# Patient Record
Sex: Female | Born: 1960 | Race: White | Hispanic: No | Marital: Married | Smoking: Former smoker
Health system: Southern US, Community
[De-identification: ages and names within clinical notes are randomized; demographics above are authoritative.]

## PROBLEM LIST (undated history)

## (undated) DIAGNOSIS — E032 Hypothyroidism due to medicaments and other exogenous substances: Secondary | ICD-10-CM

## (undated) DIAGNOSIS — F419 Anxiety disorder, unspecified: Secondary | ICD-10-CM

## (undated) DIAGNOSIS — F32A Depression, unspecified: Secondary | ICD-10-CM

## (undated) DIAGNOSIS — K219 Gastro-esophageal reflux disease without esophagitis: Secondary | ICD-10-CM

## (undated) DIAGNOSIS — G43909 Migraine, unspecified, not intractable, without status migrainosus: Secondary | ICD-10-CM

## (undated) DIAGNOSIS — R739 Hyperglycemia, unspecified: Secondary | ICD-10-CM

## (undated) DIAGNOSIS — L309 Dermatitis, unspecified: Secondary | ICD-10-CM

## (undated) DIAGNOSIS — F329 Major depressive disorder, single episode, unspecified: Secondary | ICD-10-CM

## (undated) HISTORY — DX: Dermatitis, unspecified: L30.9

## (undated) HISTORY — DX: Migraine, unspecified, not intractable, without status migrainosus: G43.909

## (undated) HISTORY — DX: Hypothyroidism due to medicaments and other exogenous substances: E03.2

## (undated) HISTORY — DX: Hyperglycemia, unspecified: R73.9

## (undated) HISTORY — DX: Anxiety disorder, unspecified: F41.9

## (undated) HISTORY — DX: Depression, unspecified: F32.A

## (undated) HISTORY — PX: COLONOSCOPY: SHX174

## (undated) HISTORY — DX: Major depressive disorder, single episode, unspecified: F32.9

## (undated) HISTORY — PX: POLYPECTOMY: SHX149

---

## 1997-09-07 HISTORY — PX: BREAST SURGERY: SHX581

## 2001-07-25 ENCOUNTER — Other Ambulatory Visit: Admission: RE | Admit: 2001-07-25 | Discharge: 2001-07-25 | Payer: Self-pay | Admitting: Obstetrics and Gynecology

## 2002-09-07 HISTORY — PX: OTHER SURGICAL HISTORY: SHX169

## 2003-08-21 ENCOUNTER — Ambulatory Visit (HOSPITAL_COMMUNITY): Admission: RE | Admit: 2003-08-21 | Discharge: 2003-08-21 | Payer: Self-pay | Admitting: Orthopedic Surgery

## 2004-07-15 ENCOUNTER — Ambulatory Visit: Payer: Self-pay | Admitting: Internal Medicine

## 2004-11-26 ENCOUNTER — Ambulatory Visit: Payer: Self-pay | Admitting: Internal Medicine

## 2004-11-28 ENCOUNTER — Ambulatory Visit (HOSPITAL_COMMUNITY): Admission: RE | Admit: 2004-11-28 | Discharge: 2004-11-28 | Payer: Self-pay | Admitting: Internal Medicine

## 2004-12-13 ENCOUNTER — Emergency Department (HOSPITAL_COMMUNITY): Admission: EM | Admit: 2004-12-13 | Discharge: 2004-12-13 | Payer: Self-pay | Admitting: Family Medicine

## 2005-04-23 ENCOUNTER — Ambulatory Visit: Payer: Self-pay | Admitting: Endocrinology

## 2005-04-23 ENCOUNTER — Ambulatory Visit: Payer: Self-pay | Admitting: Internal Medicine

## 2005-09-15 ENCOUNTER — Other Ambulatory Visit: Admission: RE | Admit: 2005-09-15 | Discharge: 2005-09-15 | Payer: Self-pay | Admitting: Obstetrics and Gynecology

## 2006-01-30 ENCOUNTER — Emergency Department (HOSPITAL_COMMUNITY): Admission: EM | Admit: 2006-01-30 | Discharge: 2006-01-30 | Payer: Self-pay | Admitting: Family Medicine

## 2006-06-22 IMAGING — CT CT HEAD W/O CM
1 series · 16 of 30 positions shown, 20 images · non-contrast
Comparison: none

HISTORY: Migraines, dizziness, right-sided facial droop

[Series 2: head_seq 4.5 h40s st · axial · 0.43mm/px · z∈[-147,-21]mm · 16 of 32 slices shown, 20 images]
[im 2/32  brain]
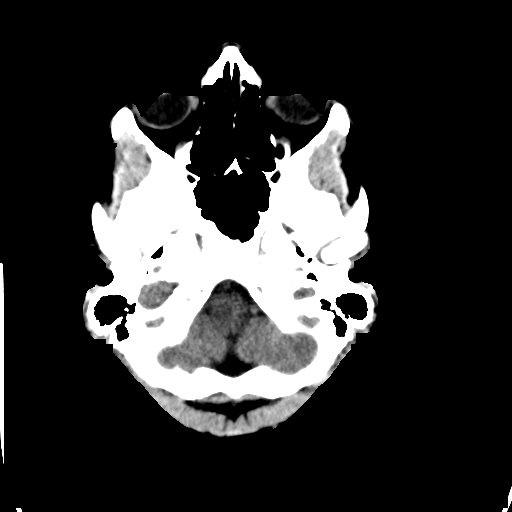
[im 2/32  bone]
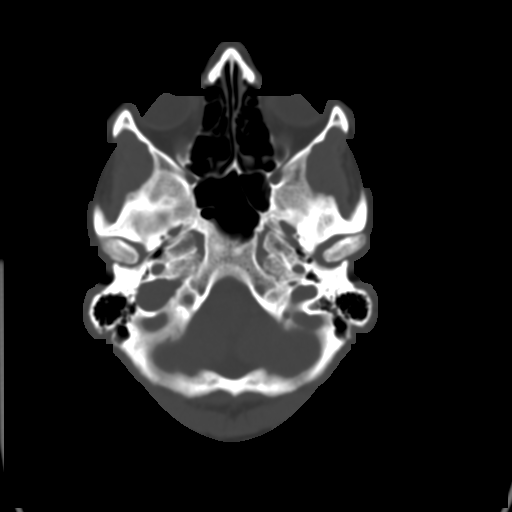
[im 4/32  brain]
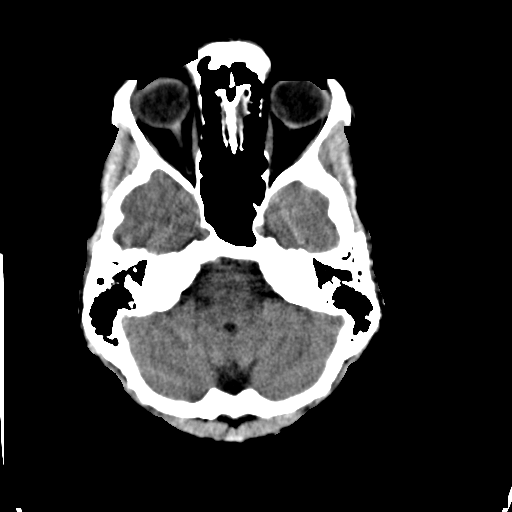
[im 6/32  brain]
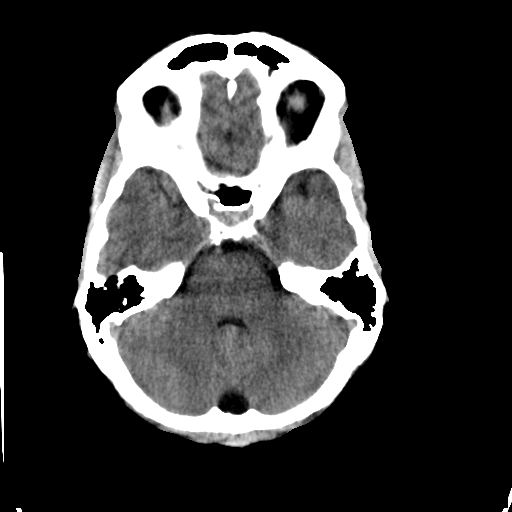
[im 8/32  brain]
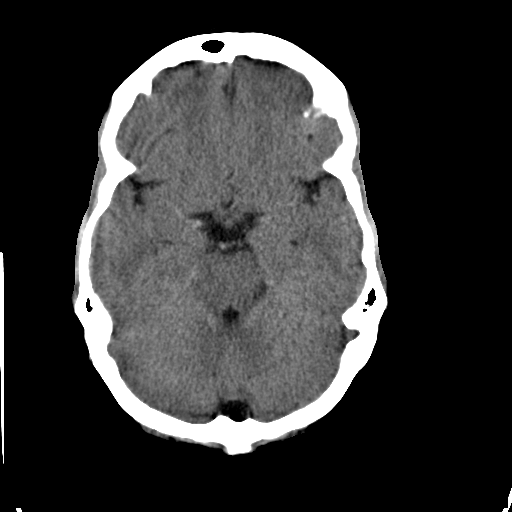
[im 9/32  brain]
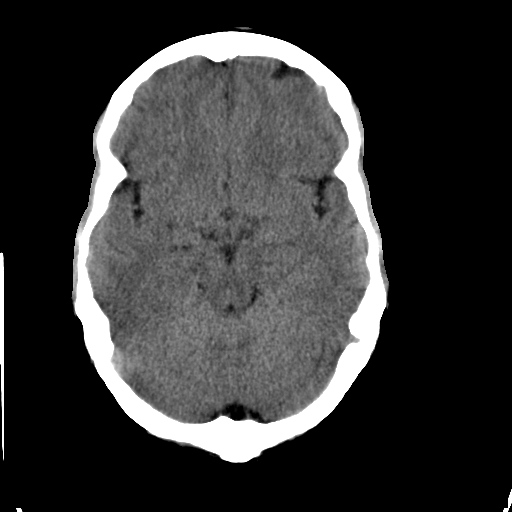
[im 9/32  bone]
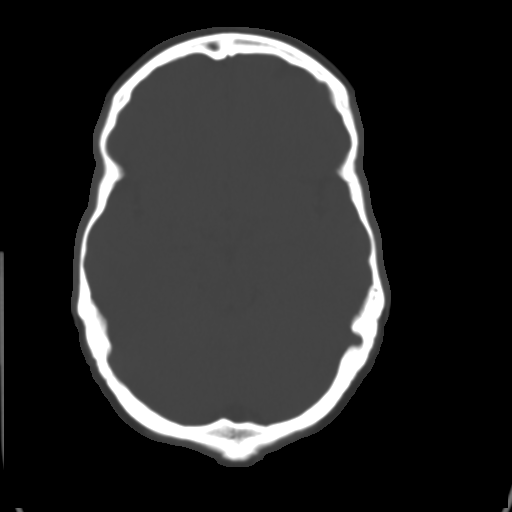
[im 11/32  brain]
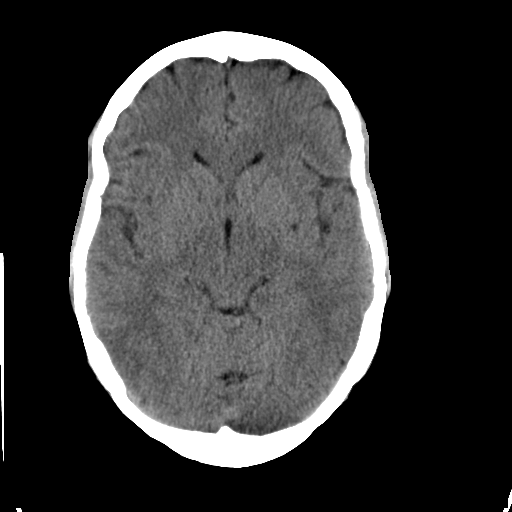
[im 13/32  brain]
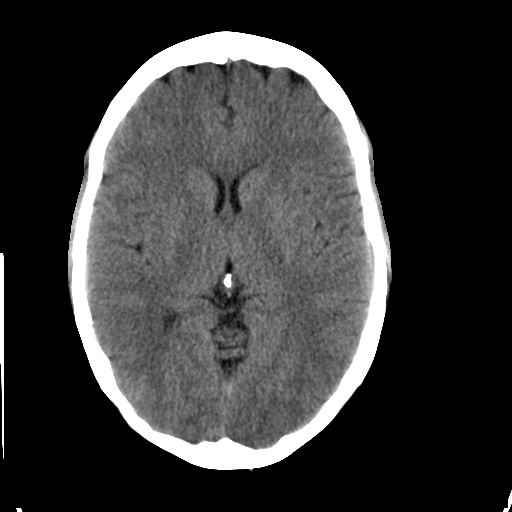
[im 15/32  brain]
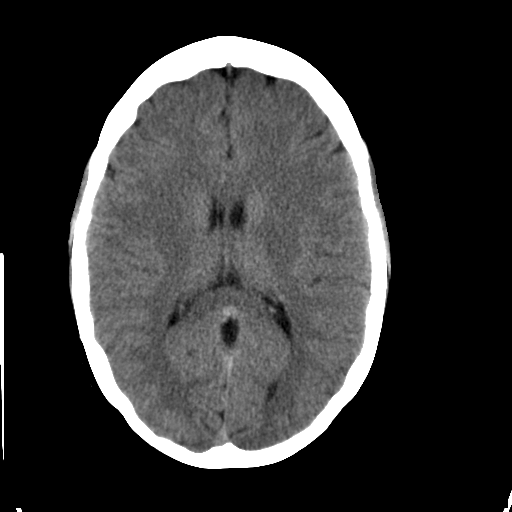
[im 17/32  brain]
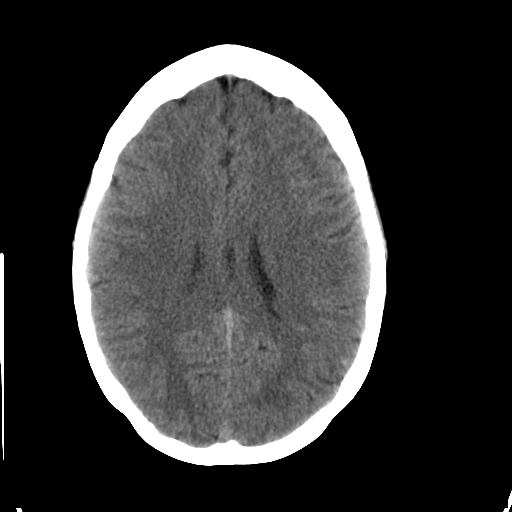
[im 17/32  bone]
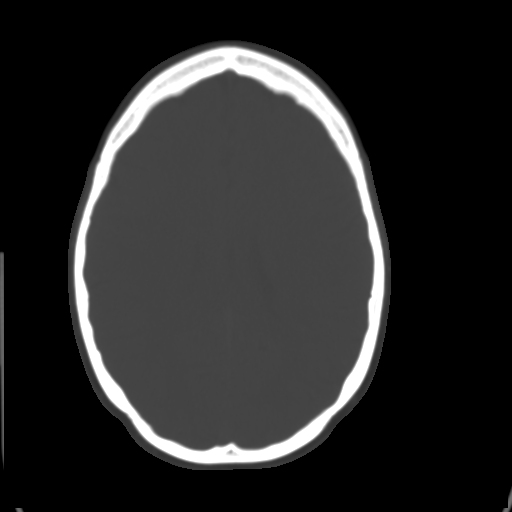
[im 19/32  brain]
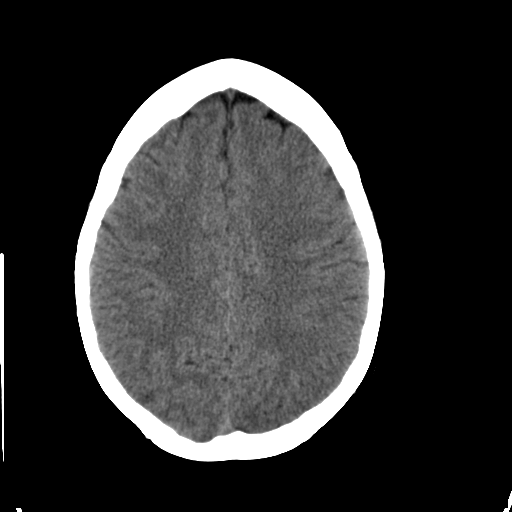
[im 21/32  brain]
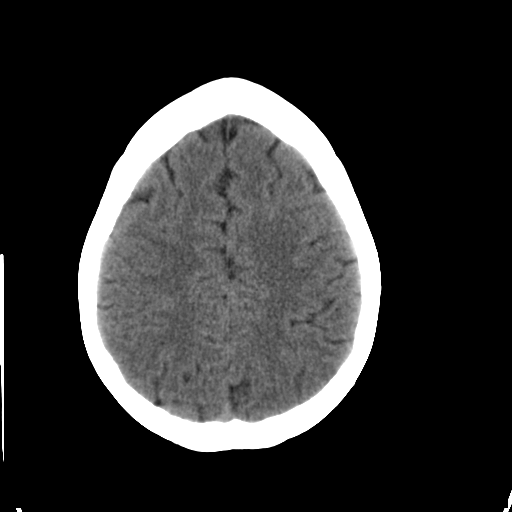
[im 23/32  brain]
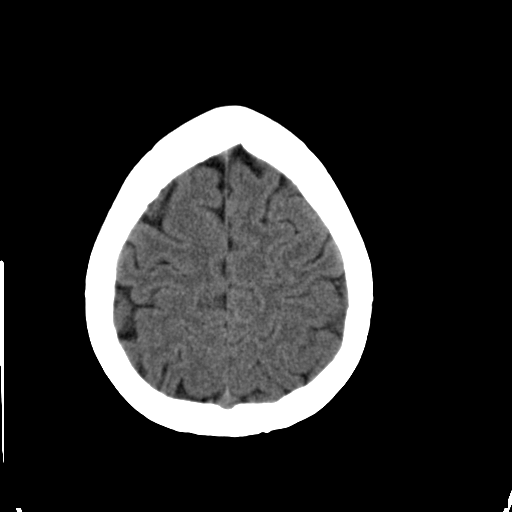
[im 24/32  brain]
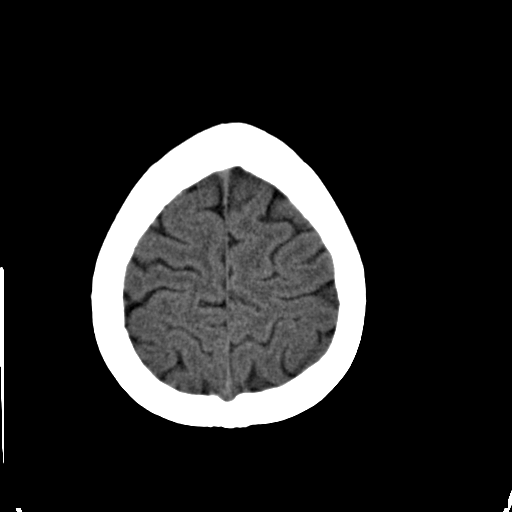
[im 24/32  bone]
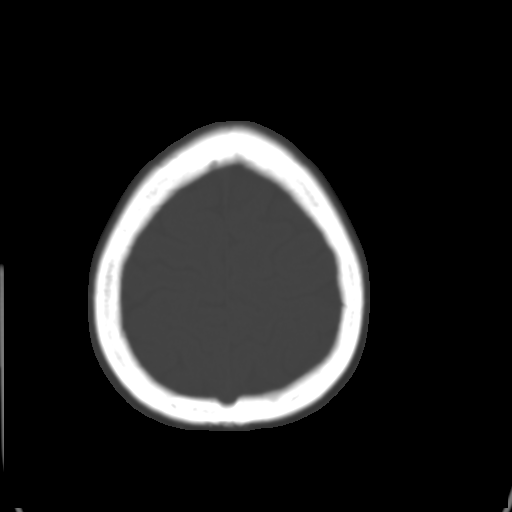
[im 26/32  brain]
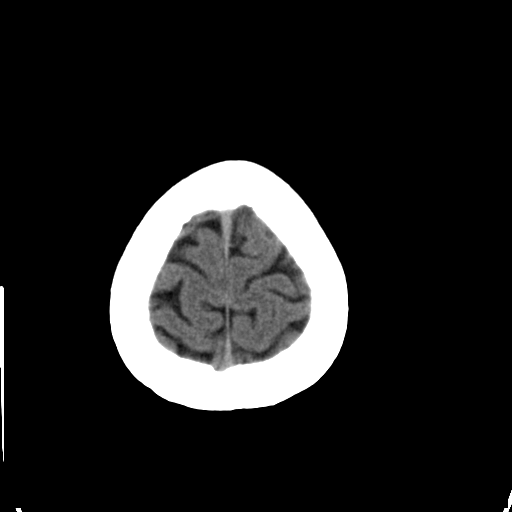
[im 28/32  brain]
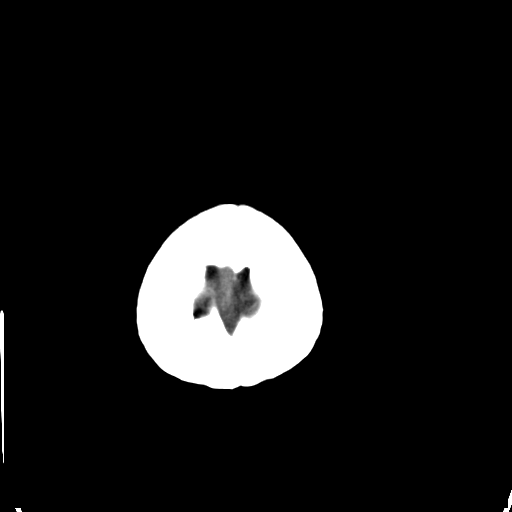
[im 30/32  brain]
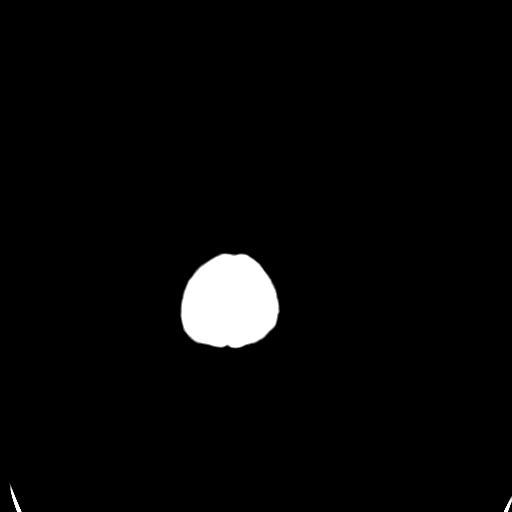

[16 of 30 positions shown; findings below may reference images not displayed]

CT HEAD WITHOUT CONTRAST:

Routine noncontrast CT without priors for comparison.

Normal ventricular morphology.
No midline shift or mass-effect.
No evidence of cortical infarct or intracranial hemorrhage.
Low attenuation focus in the inferior left basal ganglia, favor old lacunar
infarct.
Posterior fossa unremarkable.
Visualized sinuses clear.
Calvarium normal.
IMPRESSION: Probable old lacunar infarct at inferior aspect of left basal ganglia.
No acute intracranial abnormalities.

## 2006-11-25 ENCOUNTER — Emergency Department (HOSPITAL_COMMUNITY): Admission: EM | Admit: 2006-11-25 | Discharge: 2006-11-25 | Payer: Self-pay | Admitting: Emergency Medicine

## 2007-12-23 ENCOUNTER — Ambulatory Visit: Payer: Self-pay | Admitting: Gastroenterology

## 2008-01-13 ENCOUNTER — Ambulatory Visit: Payer: Self-pay | Admitting: Internal Medicine

## 2008-01-13 DIAGNOSIS — G43909 Migraine, unspecified, not intractable, without status migrainosus: Secondary | ICD-10-CM | POA: Insufficient documentation

## 2008-02-28 ENCOUNTER — Ambulatory Visit: Payer: Self-pay | Admitting: Gastroenterology

## 2008-04-06 ENCOUNTER — Ambulatory Visit: Payer: Self-pay | Admitting: Gastroenterology

## 2008-05-01 LAB — CONVERTED CEMR LAB: Pap Smear: NORMAL

## 2008-06-20 ENCOUNTER — Ambulatory Visit: Payer: Self-pay | Admitting: Internal Medicine

## 2008-07-05 ENCOUNTER — Encounter: Payer: Self-pay | Admitting: Internal Medicine

## 2009-07-29 ENCOUNTER — Ambulatory Visit (HOSPITAL_BASED_OUTPATIENT_CLINIC_OR_DEPARTMENT_OTHER): Admission: RE | Admit: 2009-07-29 | Discharge: 2009-07-29 | Payer: Self-pay | Admitting: Internal Medicine

## 2009-07-29 ENCOUNTER — Ambulatory Visit: Payer: Self-pay | Admitting: Internal Medicine

## 2009-07-29 ENCOUNTER — Ambulatory Visit: Payer: Self-pay | Admitting: Diagnostic Radiology

## 2009-07-30 ENCOUNTER — Telehealth: Payer: Self-pay | Admitting: Internal Medicine

## 2009-09-07 HISTORY — PX: THYROID SURGERY: SHX805

## 2009-11-20 ENCOUNTER — Encounter: Payer: Self-pay | Admitting: Internal Medicine

## 2009-12-02 ENCOUNTER — Encounter: Payer: Self-pay | Admitting: Internal Medicine

## 2009-12-04 ENCOUNTER — Ambulatory Visit (HOSPITAL_BASED_OUTPATIENT_CLINIC_OR_DEPARTMENT_OTHER): Admission: RE | Admit: 2009-12-04 | Discharge: 2009-12-04 | Payer: Self-pay | Admitting: Internal Medicine

## 2009-12-04 ENCOUNTER — Ambulatory Visit: Payer: Self-pay | Admitting: Internal Medicine

## 2009-12-04 ENCOUNTER — Ambulatory Visit: Payer: Self-pay | Admitting: Diagnostic Radiology

## 2009-12-04 ENCOUNTER — Telehealth: Payer: Self-pay | Admitting: Internal Medicine

## 2009-12-04 LAB — CONVERTED CEMR LAB: Free T4: 0.96 ng/dL (ref 0.80–1.80)

## 2009-12-17 ENCOUNTER — Other Ambulatory Visit: Admission: RE | Admit: 2009-12-17 | Discharge: 2009-12-17 | Payer: Self-pay | Admitting: Interventional Radiology

## 2009-12-17 ENCOUNTER — Encounter: Payer: Self-pay | Admitting: Internal Medicine

## 2009-12-17 ENCOUNTER — Encounter: Admission: RE | Admit: 2009-12-17 | Discharge: 2009-12-17 | Payer: Self-pay | Admitting: Internal Medicine

## 2009-12-24 ENCOUNTER — Telehealth: Payer: Self-pay | Admitting: Internal Medicine

## 2010-01-01 ENCOUNTER — Encounter (INDEPENDENT_AMBULATORY_CARE_PROVIDER_SITE_OTHER): Payer: Self-pay | Admitting: *Deleted

## 2010-02-10 ENCOUNTER — Encounter: Payer: Self-pay | Admitting: Internal Medicine

## 2010-02-14 ENCOUNTER — Ambulatory Visit (HOSPITAL_COMMUNITY): Admission: RE | Admit: 2010-02-14 | Discharge: 2010-02-14 | Payer: Self-pay | Admitting: Endocrinology

## 2010-03-11 ENCOUNTER — Encounter: Payer: Self-pay | Admitting: Internal Medicine

## 2010-04-24 ENCOUNTER — Ambulatory Visit (HOSPITAL_COMMUNITY): Admission: RE | Admit: 2010-04-24 | Discharge: 2010-04-25 | Payer: Self-pay | Admitting: Surgery

## 2010-04-24 ENCOUNTER — Encounter (INDEPENDENT_AMBULATORY_CARE_PROVIDER_SITE_OTHER): Payer: Self-pay | Admitting: Surgery

## 2010-05-07 ENCOUNTER — Encounter: Payer: Self-pay | Admitting: Internal Medicine

## 2010-06-25 ENCOUNTER — Encounter: Payer: Self-pay | Admitting: Internal Medicine

## 2010-10-07 NOTE — Letter (Signed)
Summary: [facility]  [facility]   Imported By: Lanelle Bal 12/13/2009 09:28:41  _____________________________________________________________________  External Attachment:    Type:   Image     Comment:   External Document

## 2010-10-07 NOTE — Progress Notes (Signed)
Summary: Thyroid Results  Phone Note Call from Patient Call back at Home Phone [phone]   Caller: Patient Reason for Call: Lab or Test Results Summary of Call: Pls contact pt when results are available Initial call taken by: Lannette Donath,  December 24, 2009 4:17 PM  Follow-up for Phone Call        call  returned to patient for additional information on results that she is requesting. Patient is requesting the results of Thyroid biopsy done last week. She states the radiologist informed her that she would notified of the results by Dr Artist Pais Follow-up by: Glendell Docker CMA,  December 24, 2009 4:27 PM  Additional Follow-up for Phone Call Additional follow up Details #1::        call pathology dept and see if biopsy results avail Additional Follow-up by: D. Thomos Lemons DO,  December 24, 2009 4:46 PM    Additional Follow-up for Phone Call Additional follow up Details #2::    CYT-Cytology - STATUS: Final  .                                         Perform Date: 12Apr11 00:01  Ordered By: Hoy Finlay MD , PROVIDER         Ordered Date:  Facility: [facility]                              Department: [facility]  Service Report Text  [facility]   [address]   [place], [place] [address]   Telephone [phone] or [phone] Fax [phone]    REPORT OF CYTOPATHOLOGY    Case #: [id number]   Patient Name: DESIA, SABAN   Office Chart Number: [id number]    MRN: [id number]   Pathologist: Luisa Hart M.D., Jonny Ruiz   DOB/Age Dec 24, 1960 (Age: 62) Gender: F   Date Taken: 12/17/2009   Date Received: 12/17/2009    FINAL DIAGNOSIS   ***Microscopic Examination and Diagnosis***   STATEMENT Of SPECIMEN ADEQUACY:   Satisfactory for evaluation.    INTERPRETATION(S):   THYROID, FINE NEEDLE ASPIRATION, LEFT: FOLLICULAR LESION.   Comment: The differential diagnosis includes a hyperplastic lesion and an   adenomatous nodule.    DATE REPORTED: 12/18/2009   *** Electronically  Signed Out by Luisa Hart M.D., Jonny Ruiz, Pathologist, Electronic Signature ***    CLINICAL HISTORY    SOURCE OF SPECIMEN(S)   Thyroid, fine needle aspiration    SPECIMEN COMMENTS:   1. Left thyroid nodule on outside mri, llp dominant nodule 2.5 x 1.2 x 1.4cm    Gross Description   Specimen: Received is/are 30cc's of light peach cytolyt solution and 6 slides in   95% ethyl alcohol.(gw:gw)   Prepared:   # smears: 6   # concentration technique slides (i.e. Thinprep): 1   # cell block: 1   additional studies: N/A   Additional Information  HL7 RESULT STATUS : F  External IF Update Timestamp : 2009-12-18:17:22:00.000000  Follow-up by: Glendell Docker CMA,  December 25, 2009 10:35 AM  Additional Follow-up for Phone Call Additional follow up Details #3:: Details for Additional Follow-up Action Taken: Pls call pt at [phone] to let her know the results of her tests Diane Tomerlin  December 30, 2009 3:14 PM  called pt - left message to return call re:  biopsy results D.  Thomos Lemons DO  December 31, 2009 1:22 PM  I discussed results with pt.  I advised she follow up with endo re:  mgt of thyroid nodule Additional Follow-up by: D. Thomos Lemons DO,  December 31, 2009 3:41 PM

## 2010-10-07 NOTE — Assessment & Plan Note (Signed)
Summary: F/u on a MRI - jr   Vital Signs:  Patient profile:   50 year old female Height:      72 inches Weight:      178.50 pounds BMI:     24.30 O2 Sat:      99 % on Room air Temp:     98.5 degrees F oral Pulse rate:   60 / minute Pulse rhythm:   regular Resp:     14 per minute BP sitting:   100 / 70  (right arm) Cuff size:   large  Vitals Entered By: Glendell Docker CMA (December 04, 2009 10:48 AM)  O2 Flow:  Room air CC: Rm 2- Thyroid evaluation   Primary Care Provider:  Dondra Spry DO  CC:  Rm 2- Thyroid evaluation.  History of Present Illness: 50 y/o white female with hx of migraine headache and dysthymia for followup she has been followed by dentist and oral surgeon abnormal left mandible MRI of neck obtained. It showed incidental small left thyroid nodule pt is asymptomatic. no prior hx of radiation exposure no family hx of thyroid cancer wt gain since prev visit  Preventive Screening-Counseling & Management  Alcohol-Tobacco     Smoking Status: quit  Allergies (verified): No Known Drug Allergies  Past History:  Past Medical History: Migraine / cluster Headaches      Past Surgical History: Right knee arthroscopic surgery 2004 Breast augmentation 1999 (saline)    Family History: Father with history of colon cancer - diagnosed at age 87 Mother with history of breast caner diagnosed at age 63 No family hx of thyroid cancer   Social History: Occupation:  Radiographer, therapeutic Married with two daughters Never Smoked Alcohol use-yes    Physical Exam  General:  alert, well-developed, and well-nourished.   Neck:  supple, no masses, no thyromegaly, and no thyroid nodules or tenderness.     Impression & Recommendations:  Problem # 1:  THYROID NODULE, LEFT (ICD-241.0) Incidental left thyroid nodule.  Pt asymptomatic.   arrange TFTs and thyroid U/S  Orders: Ultrasound (Ultrasound) T-TSH ([address]-60454) T-T4, Free (09811-91478)  Complete Medication  List: 1)  Imitrex 100 Mg Tabs (Sumatriptan succinate) .... One by mouth qd 2)  Sertraline Hcl 50 Mg Tabs (Sertraline hcl) .... Take 1 tablet by mouth once a day 3)  Omeprazole 20 Mg Cpdr (Omeprazole) .... One by mouth two times a day 4)  Cyclobenzaprine Hcl 5 Mg Tabs (Cyclobenzaprine hcl) .... One by mouth at bedtime as needed  Current Allergies (reviewed today): No known allergies    Preventive Care Screening  Pap Smear:    Date:  10/01/2009    Results:  normal   Mammogram:    Date:  08/27/2009    Results:  normal

## 2010-10-07 NOTE — Letter (Signed)
Summary: [facility]   Imported By: Lanelle Bal 07/14/2010 12:10:44  _____________________________________________________________________  External Attachment:    Type:   Image     Comment:   External Document

## 2010-10-07 NOTE — Letter (Signed)
Summary: [facility]   Imported By: Lanelle Bal 04/25/2010 09:57:56  _____________________________________________________________________  External Attachment:    Type:   Image     Comment:   External Document

## 2010-10-07 NOTE — Progress Notes (Signed)
  Phone Note Outgoing Call   Summary of Call: call pt - thyroid u/s confirms left lower thyroid nodule 2.5 x 1.2 x 1.4 cm in size.  I rec we arrange FNA   Initial call taken by: D. Thomos Lemons DO,  December 04, 2009 9:00 PM  Follow-up for Phone Call        thyroid blood tests are normal Follow-up by: D. Thomos Lemons DO,  December 05, 2009 8:22 AM  Additional Follow-up for Phone Call Additional follow up Details #1::        Pt notified as directed     will schedule FNA  at W/L   Additional Follow-up by: Darral Dash,  December 05, 2009 10:26 AM

## 2010-10-07 NOTE — Letter (Signed)
Summary: Primary Care Consult Scheduled Letter  [facility]  [address]   [place], [place] [address]   Phone: [phone]  Fax: [phone]      01/01/2010 MRN: [id number]  Elizabeth Obrien [address] [place], [place]  [address]    Dear [name],      We have scheduled an appointment for you.  At the recommendation of Dr._YOO, we have scheduled you a consult with DR [facility] on JUNE 6,2011 at [facility].  Their address is_[address], [place]  . The office phone number is [phone].  If this appointment day and time is not convenient for you, please feel free to call the office of the doctor you are being referred to at the number listed above and reschedule the appointment.     It is important for you to keep your scheduled appointments. We are here to make sure you are given good patient care. If you have questions or you have made changes to your appointment, please notify us at  [phone], ask for HELEN.    Thank you,  Darral Dash Patient Care Coordinator [place] [facility]

## 2010-10-07 NOTE — Letter (Signed)
Summary: [facility]   Imported By: Lanelle Bal 03/27/2010 07:48:39  _____________________________________________________________________  External Attachment:    Type:   Image     Comment:   External Document

## 2010-10-07 NOTE — Letter (Signed)
Summary: [facility]   Imported By: Lanelle Bal 06/10/2010 10:25:02  _____________________________________________________________________  External Attachment:    Type:   Image     Comment:   External Document

## 2010-11-03 ENCOUNTER — Telehealth (INDEPENDENT_AMBULATORY_CARE_PROVIDER_SITE_OTHER): Payer: Self-pay | Admitting: *Deleted

## 2010-11-13 NOTE — Progress Notes (Signed)
  Phone Note Other Incoming   Request: Send information Summary of Call: Request for records received from [facility]. Request forwarded to [employer].  Last 3 yrs-Yoo

## 2010-11-21 LAB — CBC
HCT: 39.5 % (ref 36.0–46.0)
MCV: 92.7 fL (ref 78.0–100.0)
Platelets: 196 10*3/uL (ref 150–400)
RBC: 4.27 MIL/uL (ref 3.87–5.11)
WBC: 5.9 10*3/uL (ref 4.0–10.5)

## 2010-11-21 LAB — BASIC METABOLIC PANEL
CO2: 27 mEq/L (ref 19–32)
Calcium: 10 mg/dL (ref 8.4–10.5)
GFR calc Af Amer: >60 mL/min (ref >60)
Potassium: 3.9 mEq/L (ref 3.5–5.1)
Sodium: 141 mEq/L (ref 135–145)

## 2010-11-21 LAB — SURGICAL PCR SCREEN
MRSA, PCR: NEGATIVE
Staphylococcus aureus: NEGATIVE

## 2010-11-21 LAB — DIFFERENTIAL
Eosinophils Absolute: 0.1 10*3/uL (ref 0.0–0.7)
Eosinophils Relative: 2 % (ref 0–5)

## 2010-11-21 LAB — PROTIME-INR
INR: 1 (ref 0.00–1.49)
Prothrombin Time: 13.4 seconds (ref 11.6–15.2)

## 2010-11-21 LAB — URINALYSIS, ROUTINE W REFLEX MICROSCOPIC
Protein, ur: NEGATIVE mg/dL
Specific Gravity, Urine: 1.006 (ref 1.005–1.030)
Urobilinogen, UA: 0.2 mg/dL (ref 0.0–1.0)
pH: 6 (ref 5.0–8.0)

## 2011-01-20 NOTE — Assessment & Plan Note (Signed)
[facility]                         GASTROENTEROLOGY OFFICE NOTE   Elizabeth Obrien, Elizabeth Obrien                     MRN:          [id number]  DATE:12/23/2007                            DOB:          [birth year]    PROBLEM:  Nausea.   Elizabeth Obrien is a pleasant, 50 year old white female, self-referred  for evaluation of nausea.  Since September of 2008, she has been  complaining of chronic nausea throughout the day.  It is not worsened  postprandially.  She does have migraine headaches and, when she develops  headaches, she will vomit, as well.  She has had frequent regurgitation  of gastric contents.  She has frequent pyrosis, as well.  She denies  dysphagia or odynophagia.  Omeprazole has not helped her symptoms.  She  is on no gastric irritants, including nonsteroidals, though, prior to  September, she had been taking nonsteroidals intermittently.  There has  been no change in her bowel habits.  She has been taking sertraline and  Topamax for years.   FAMILY HISTORY:  Pertinent for father who had colon cancer at age 57.   PAST MEDICAL HISTORY:  Unremarkable.   FAMILY HISTORY:  Also pertinent for mother with stomach cancer.   MEDICATIONS:  Include sertraline, Topamax, Prilosec, magnesium and fish  oil.   ALLERGIES:  She has no allergies.   SOCIAL HISTORY:  She neither smokes nor drinks.  She is married and  works as an Astronomer.   REVIEW OF SYSTEMS:  Was reviewed and is positive for occasional urinary  incontinence.   PHYSICAL EXAMINATION:  Pulse 88, blood pressure 94/58, weight 165.  HEENT: EOMI.  PERRLA.  Sclerae are anicteric.  Conjunctivae are pink.  NECK:  Supple without thyromegaly, adenopathy or carotid bruits.  CHEST:  Clear to auscultation and percussion without adventitious  sounds.  CARDIAC:  Regular rhythm; normal S1 S2.  There are no murmurs, gallops  or rubs.  ABDOMEN:  There is no succussion splash.  Bowel sounds are normoactive.  Abdomen is soft, nontender and nondistended.  There are no abdominal  masses, tenderness, splenic enlargement or hepatomegaly.  EXTREMITIES:  Full range of motion.  No cyanosis, clubbing or edema.  RECTAL:  There are no masses.  Stool is Hemoccult negative.  SKELETAL EXAM:  There are no deformities.  NEUROLOGIC EXAM:  Nonfocal.   IMPRESSION:  Persistent nausea with pyrosis.  Symptoms certainly could  be due to reflux that has been refractory to omeprazole.  Gastroparesis  may be a contributing factor, as well.   RECOMMENDATION:  1. Trial of Capadex 50 mg twice a day.  2. Upper endoscopy.  3. To consider gastric emptying scan if symptoms are no better and      endoscopy is not revealing.     Barbette Hair. Arlyce Dice, MD,FACG  Electronically Signed    RDK/MedQ  DD: 12/23/2007  DT: 12/23/2007  Job #: [id number]

## 2011-01-20 NOTE — Letter (Signed)
December 23, 2007    Mrs. Johnette Abraham   RE:  MIASHA, EMMONS  MRN:  [id number]  /  DOB:  1961-01-20   Dear Mrs. Colville:   It is my pleasure to have treated you recently as a new patient in my  office.  I appreciate your confidence and the opportunity to participate  in your care.   Since I do have a busy inpatient endoscopy schedule and office schedule,  my office hours vary weekly.  I am, however, available for emergency  calls every day through my office.  If I cannot promptly meet an urgent  office appointment, another one of our gastroenterologists will be able  to assist you.   My well-trained staff are prepared to help you at all times.  For  emergencies after office hours, a physician from our gastroenterology  section is always available through my 24-hour answering service.   While you are under my care, I encourage discussion of your questions  and concerns, and I will be happy to return your calls as soon as I am  available.   Once again, I welcome you as a new patient and I look forward to a happy  and healthy relationship.    Sincerely,      Barbette Hair. Arlyce Dice, MD,FACG  Electronically Signed   RDK/MedQ  DD: 12/23/2007  DT: 12/23/2007  Job #: [id number]

## 2011-01-23 NOTE — Op Note (Signed)
NAMESHYNIECE, SCRIPTER NO.:  [id number]   MEDICAL RECORD NO.:  [id number]                   PATIENT TYPE:  OIB   LOCATION:  2899                                 FACILITY:  [facility]   PHYSICIAN:  Burnard Bunting, M.D.                 DATE OF BIRTH:  May 04, 1961   DATE OF PROCEDURE:  08/21/2003  DATE OF DISCHARGE:                                 OPERATIVE REPORT   PREOPERATIVE DIAGNOSIS:  Right knee posterior horn medial meniscal tear.   POSTOPERATIVE DIAGNOSIS:  Right knee posterior horn medial meniscal tear.   PROCEDURE:  Right knee diagnostic and operative arthroscopy with partial  posterior horn medial meniscectomy.   SURGEON ATTENDING:  Burnard Bunting, M.D.   ANESTHESIA:  LMA plus block.   ESTIMATED BLOOD LOSS:  3 mL.   DRAINS:  None.   OPERATIVE FINDINGS:  1. Examination under anesthesia:  Range of motion 0 to 130 with stability to     varus and varus stress at 0 and 30 degrees, ACL and PCL intact, no     posterolateral rotatory instability was noted.  2. Diagnostic and operative arthroscopy:     a. 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     b. No loose bodies in the medial or lateral gutters.     c. Intact lateral compartment with intact lateral meniscus and articular        cartilage.     d. Intact ACL and PCL.     e. 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   PROCEDURE IN DETAIL:  The patient was brought to the operating room where  LMA anesthesia was induced.  A perioperative block had been administered.  The right knee, leg and foot were prepped with Duraprep solution and were  draped in a  sterile manner.  Topographical anatomy of the knee was  identified including the medial and lateral margins of the patella as well  as the joint line and both inferior, medial and lateral margins of the  patellar tendon.  Anterior inferolateral portal was first established,  anterior inferomedial portal was then established after spinal needle  localization.  The diagnostic arthroscopy was performed.  The patellofemoral  compartment was intact, except for some mild chondromalacia on the medial  and lateral facets of the patella.  The trochlea itself was intact.  There  were loose bodies in the medial and lateral gutter.  The lateral compartment  in a figure-of-four in position was inspected and found to be intact with  stable meniscus and intact articular cartilage.  The ACL and PCL were  intact.  Medial compartment demonstrated a tear in the posterior horn of the  medial meniscus involving about 50-60% anteroposterior depth of the meniscus  over half of its circumference.  There was an unstable tear with fraying and  degeneration.  This tear was debrided back to a stable rim using a  combination of basket punch and shavers.  Following  debridement, the  meniscus was probed and found to be stable.  At this time, the joint was  thoroughly  irrigated.  Instruments were removed from the portals, which were then  closed using 3-0 nylon suture.  A combination of morphine, Marcaine and  clonidine was then injected into the knee.  The patient was then placed in  the bulky knee wrap.  She tolerated the procedure well without immediate  complications.                                               Burnard Bunting, M.D.    GSD/MEDQ  D:  08/21/2003  T:  08/22/2003  Job:  [id number]

## 2011-06-03 ENCOUNTER — Ambulatory Visit (HOSPITAL_BASED_OUTPATIENT_CLINIC_OR_DEPARTMENT_OTHER)
Admission: RE | Admit: 2011-06-03 | Discharge: 2011-06-03 | Disposition: A | Discharge status: Home / Self Care | Payer: BC Managed Care – PPO | Source: Ambulatory Visit | Attending: Orthopedic Surgery | Admitting: Orthopedic Surgery

## 2011-06-03 DIAGNOSIS — M2240 Chondromalacia patellae, unspecified knee: Secondary | ICD-10-CM | POA: Insufficient documentation

## 2011-06-03 DIAGNOSIS — X58XXXA Exposure to other specified factors, initial encounter: Secondary | ICD-10-CM | POA: Insufficient documentation

## 2011-06-03 DIAGNOSIS — IMO0002 Reserved for concepts with insufficient information to code with codable children: Secondary | ICD-10-CM | POA: Insufficient documentation

## 2011-06-03 DIAGNOSIS — M25569 Pain in unspecified knee: Secondary | ICD-10-CM | POA: Insufficient documentation

## 2011-06-03 DIAGNOSIS — Z79899 Other long term (current) drug therapy: Secondary | ICD-10-CM | POA: Insufficient documentation

## 2011-06-03 LAB — POCT HEMOGLOBIN-HEMACUE: Hemoglobin: 13.8 g/dL (ref 12.0–15.0)

## 2011-06-17 NOTE — Op Note (Signed)
  NAMESHALAE, BELMONTE NO.:  [id number]  MEDICAL RECORD NO.:  [id number]  LOCATION:                                 FACILITY:  PHYSICIAN:  Elizabeth Obrien, M.D.         DATE OF BIRTH:  DATE OF PROCEDURE:  06/03/2011 DATE OF DISCHARGE:                              OPERATIVE REPORT   PREOPERATIVE DIAGNOSIS:  Left knee medial meniscal tear.  POSTOPERATIVE DIAGNOSES:  Left knee medial meniscal tear plus chondral defect, medial femoral condyle.  PROCEDURE:  Left knee arthroscopy, meniscal debridement, and chondroplasty.  SURGEON:  Elizabeth Gross, MD  ASSISTANT:  None.  ANESTHESIA:  General.  ESTIMATED BLOOD LOSS:  Minimal.  DRAIN:  None.  COMPLICATIONS:  None.  CONDITION:  Stable to Recovery.  BRIEF CLINICAL NOTE:  Elizabeth Obrien is a 50 year old female, several month history of significant with left knee pain, recurrent effusion, mechanical symptoms.  Exam and history suggested medial meniscal tear, confirmed by MRI.  She presents for arthroscopy and debridement.  PROCEDURE IN DETAIL:  After successful administration of general anesthetic, a tourniquet was placed high on the left thigh, and left lower extremity prepped and draped in usual sterile fashion.  Standard superomedial and inferolateral incisions were made, inflow cannula passed superomedial and camera passed inferolateral.  Arthroscopic visualization proceeds.  Undersurface of the patella shows some grade 1 and 2 chondromalacia, but no full-thickness chondral defects and no unstable cartilage.  The trochlea looked normal.  Medial and lateral gutters were visualized, there were no loose bodies.  Flexion and valgus force was applied to the knee and the medial compartment was entered. There was evidence of a significant unstable tear in the body and posterior horn of the medial meniscus.  There was also some chondromalacia of the medial femoral condyle with a small area of unstable cartilage.  The  spinal needle was used to localize inferomedial portal.  Small incision made and dilator placed.  Meniscus was debrided back to stable base with baskets and a 4.2 mm shaver and then sealed off with the ArthroCare device.  Unstable cartilage undersurface of the medial femoral condyle was debrided back to stable cartilaginous surface with stable edges.  The intercondylar notch was visualized, ACL was normal.  Lateral compartment was entered it looked normal.  Joint was again inspected.  No other tears, defects, or loose bodies noted. Arthroscopic equipments were then removed from the inferior portals which was closed with interrupted 4-0 nylon.  A 20 cc of 0.25% Marcaine with epinephrine were injected through the inflow cannula, that was removed, and that portal closed with nylon. The incision was then cleaned and dried and a bulky sterile dressing applied.  She was then awakened and transported to Recovery in stable condition.     Elizabeth Obrien, M.D.     FA/MEDQ  D:  06/03/2011  T:  06/03/2011  Job:  [id number]  Electronically Signed by Elizabeth Obrien M.D. on 06/17/2011 11:18:09 AM

## 2012-09-05 ENCOUNTER — Encounter: Payer: Self-pay | Admitting: Internal Medicine

## 2012-09-05 ENCOUNTER — Ambulatory Visit (INDEPENDENT_AMBULATORY_CARE_PROVIDER_SITE_OTHER)
Admission: RE | Admit: 2012-09-05 | Discharge: 2012-09-05 | Disposition: A | Discharge status: Home / Self Care | Payer: 59 | Source: Ambulatory Visit | Attending: Internal Medicine | Admitting: Internal Medicine

## 2012-09-05 ENCOUNTER — Ambulatory Visit (INDEPENDENT_AMBULATORY_CARE_PROVIDER_SITE_OTHER): Payer: 59 | Admitting: Internal Medicine

## 2012-09-05 ENCOUNTER — Ambulatory Visit: Payer: 59

## 2012-09-05 VITALS — BP 112/78 | Temp 98.2°F | Ht 71.0 in | Wt 184.0 lb

## 2012-09-05 DIAGNOSIS — R071 Chest pain on breathing: Secondary | ICD-10-CM

## 2012-09-05 DIAGNOSIS — G43909 Migraine, unspecified, not intractable, without status migrainosus: Secondary | ICD-10-CM

## 2012-09-05 DIAGNOSIS — R0789 Other chest pain: Secondary | ICD-10-CM

## 2012-09-05 DIAGNOSIS — E041 Nontoxic single thyroid nodule: Secondary | ICD-10-CM

## 2012-09-05 LAB — CBC WITH DIFFERENTIAL/PLATELET
Lymphs Abs: 2.5 10*3/uL (ref 0.7–4.0)
Monocytes Absolute: 0.5 10*3/uL (ref 0.1–1.0)
Platelets: 221 10*3/uL (ref 150–400)
RDW: 13.5 % (ref 11.5–15.5)

## 2012-09-05 LAB — D-DIMER, QUANTITATIVE: D-Dimer, Quant: 0.41 ug/mL-FEU (ref 0.00–0.48)

## 2012-09-05 MED ORDER — TRAMADOL HCL 50 MG PO TABS: 50.0000 mg | ORAL_TABLET | Freq: Three times a day (TID) | ORAL | Status: DC | PRN

## 2012-09-05 MED ORDER — SUMATRIPTAN SUCCINATE 100 MG PO TABS: 100.0000 mg | ORAL_TABLET | ORAL | Status: DC | PRN

## 2012-09-05 NOTE — Assessment & Plan Note (Signed)
51 year old white female with pleuritic type chest pain on right side. Physical exam is normal. Patient may have pleurisy versus musculoskeletal etiology. Obtain chest x-ray and d-dimer. If negative, patient advised to use tramadol 50 mg 3 times a day as needed. If persistent pain, reassess in one week.

## 2012-09-05 NOTE — Assessment & Plan Note (Signed)
Patient with chronic periodic migraines. Refilled Imitrex.

## 2012-09-05 NOTE — Assessment & Plan Note (Signed)
Patient underwent subtotal thyroidectomy. Pathology was negative for thyroid cancer.  She has iatrogenic hypothyroidism. She is followed by Dr. Lurene Shadow

## 2012-09-05 NOTE — Progress Notes (Signed)
  Subjective:    Patient ID: Elizabeth Obrien, female    DOB: 03/15/1961, 51 y.o.   MRN: [id number]  HPI  51 year old white female with history of benign thyroid nodule and migraine headache complains of acute right mid upper back pain that started Saturday morning. She denies any specific trigger. She woke up with pain. It lasted all day Saturday. She describes a stabbing sensation that is worse with taking a deep breath. She denies any cough or shortness of breath. No preceding illness. She tried using muscle relaxers and getting in a hot tub without any relief.  She has tender area right side of back. No obvious rash.   Review of Systems Negative for chest pain or shortness of breath  Past Medical History  Diagnosis Date  . Migraine   . Thyroid nodule 2011  . Hypothyroidism, iatrogenic     Follow by Dr. Lurene Shadow    History   Social History  . Marital Status: Married    Spouse Name: N/A    Number of Children: 2  . Years of Education: N/A   Occupational History  . PACE nurse [employer]   Social History Main Topics  . Smoking status: Former Games developer  . Smokeless tobacco: Not on file  . Alcohol Use: Yes  . Drug Use: Not on file  . Sexually Active: Not on file   Other Topics Concern  . Not on file   Social History Narrative  . No narrative on file    Past Surgical History  Procedure Date  . Right knee 2004    arthroscopic  . Breast surgery 1999    augmentation - saline  . Thyroid surgery 2011    Family History  Problem Relation Age of Onset  . Cancer Mother 49    breast  . Cancer Father     colon  . Cancer Other     thyroid    Not on File  Current Outpatient Prescriptions on File Prior to Visit  Medication Sig Dispense Refill  . levothyroxine (SYNTHROID, LEVOTHROID) 50 MCG tablet Take 50 mcg by mouth daily.      . SUMAtriptan (IMITREX) 100 MG tablet Take 1 tablet (100 mg total) by mouth every 2 (two) hours as needed for migraine.  10 tablet  5    BP  112/78  Temp 98.2 F (36.8 C) (Oral)  Ht 5\' 11"  (1.803 m)  Wt 184 lb (83.462 kg)  BMI 25.66 kg/m2       Objective:   Physical Exam  Constitutional: She appears well-developed and well-nourished.  HENT:  Head: Normocephalic and atraumatic.  Right Ear: External ear normal.  Left Ear: External ear normal.  Mouth/Throat: Oropharynx is clear and moist.  Neck: Neck supple.       No neck tenderness  Cardiovascular: Normal rate, regular rhythm and normal heart sounds.   No murmur heard. Pulmonary/Chest: Effort normal and breath sounds normal. She has no wheezes. She has no rales.       Right posterior chest wall/back tenderness (thoracic level T9-T10)  Abdominal: Soft. Bowel sounds are normal. There is no tenderness.  Musculoskeletal: She exhibits no edema.  Lymphadenopathy:    She has no cervical adenopathy.  Skin: Skin is warm and dry. No rash noted.  Psychiatric: She has a normal mood and affect. Her behavior is normal.          Assessment & Plan:

## 2012-09-05 NOTE — Patient Instructions (Signed)
Our office will contact you re: blood test and chest x ray results. Please call our office if your symptoms do not improve or gets worse.

## 2013-02-14 ENCOUNTER — Encounter: Payer: Self-pay | Admitting: Gastroenterology

## 2013-08-23 ENCOUNTER — Telehealth: Payer: Self-pay | Admitting: Internal Medicine

## 2013-08-23 NOTE — Telephone Encounter (Signed)
Patient Information:  Caller Name: GREGORIA  Phone: [phone]  Patient: Elizabeth Obrien  Gender: Female  DOB: [birth year]  Age: 52 Years  PCP: Artist Pais Doe-Hyun Molly Maduro) (Adults only)  Pregnant: No  Office Follow Up:  Does the office need to follow up with this patient?: No  Instructions For The Office: N/A   Symptoms  Reason For Call & Symptoms: Patient refuses triage x 2 attempts and states she cut her hand with a knife and only wants to know if Dr. Artist Pais does stitches in the office because if he does NOT then she is just going on to work and having them take care of it there. When attempting to provide this information to the patient, she states "this is my work calling, I have to go" and disconnected.  Reviewed Health History In EMR: N/A  Reviewed Medications In EMR: N/A  Reviewed Allergies In EMR: N/A  Reviewed Surgeries / Procedures: N/A  Date of Onset of Symptoms: Unknown OB / GYN:  LMP: Unknown  Guideline(s) Used:  No Protocol Available - Information Only  Disposition Per Guideline:   Home Care  Reason For Disposition Reached:   Information only question and nurse able to answer  Advice Given:  N/A  Patient Refused Recommendation:  Patient Refused Care Advice  Patient disconnected.

## 2013-11-30 DIAGNOSIS — L988 Other specified disorders of the skin and subcutaneous tissue: Secondary | ICD-10-CM | POA: Insufficient documentation

## 2014-06-26 LAB — HM MAMMOGRAPHY: HM MAMMO: NORMAL

## 2014-07-05 ENCOUNTER — Encounter: Payer: Self-pay | Admitting: Internal Medicine

## 2014-07-13 ENCOUNTER — Ambulatory Visit: Payer: 59 | Admitting: Internal Medicine

## 2014-07-25 ENCOUNTER — Ambulatory Visit (INDEPENDENT_AMBULATORY_CARE_PROVIDER_SITE_OTHER): Payer: 59 | Admitting: Family Medicine

## 2014-07-25 ENCOUNTER — Encounter: Payer: Self-pay | Admitting: Family Medicine

## 2014-07-25 VITALS — BP 110/82 | HR 61 | Temp 98.4°F | Ht 71.0 in | Wt 180.1 lb

## 2014-07-25 DIAGNOSIS — R21 Rash and other nonspecific skin eruption: Secondary | ICD-10-CM

## 2014-07-25 DIAGNOSIS — G43909 Migraine, unspecified, not intractable, without status migrainosus: Secondary | ICD-10-CM

## 2014-07-25 MED ORDER — TRIAMCINOLONE ACETONIDE 0.1 % EX OINT: 1.0000 "application " | TOPICAL_OINTMENT | Freq: Two times a day (BID) | CUTANEOUS | Status: DC

## 2014-07-25 MED ORDER — SUMATRIPTAN SUCCINATE 100 MG PO TABS: 100.0000 mg | ORAL_TABLET | ORAL | Status: DC | PRN

## 2014-07-25 NOTE — Patient Instructions (Signed)
For Rash: -try triamcinilone cream twice daily -CERAVE CREAM twice daily -aquafor at work and avoid water as much as possible at home and work  Follow up in 1 month - schedule New Patient Visit if you will be transferring care.

## 2014-07-25 NOTE — Progress Notes (Signed)
Pre visit review using our clinic review tool, if applicable. No additional management support is needed unless otherwise documented below in the visit note. 

## 2014-07-25 NOTE — Progress Notes (Signed)
HPI:  Acute visit for:  1) Rash: -itchy on hands and feet -for a long time, at least 9 months -otc hydrocortisone helps a little -denies pain or ulcers  2)Migraines: -needs refill on imitrex -chronic for many years, has seen multiple headache specialist   ROS: See pertinent positives and negatives per HPI.  Past Medical History  Diagnosis Date  . Migraine   . Thyroid nodule 2011  . Hypothyroidism, iatrogenic     Follow by Dr. Bubba Camp    Past Surgical History  Procedure Laterality Date  . Right knee  2004    arthroscopic  . Breast surgery  1999    augmentation - saline  . Thyroid surgery  2011    Family History  Problem Relation Age of Onset  . Cancer Mother 69    breast  . Cancer Father     colon  . Cancer Other     thyroid    History   Social History  . Marital Status: Married    Spouse Name: N/A    Number of Children: 2  . Years of Education: N/A   Occupational History  . [employer]   Social History Main Topics  . Smoking status: Former Research scientist (life sciences)  . Smokeless tobacco: None  . Alcohol Use: Yes  . Drug Use: None  . Sexual Activity: None   Other Topics Concern  . None   Social History Narrative    Current outpatient prescriptions: aspirin-acetaminophen-caffeine (EXCEDRIN MIGRAINE) 250-250-65 MG per tablet, Take by mouth every 6 (six) hours as needed for headache., Disp: , Rfl: ;  levothyroxine (SYNTHROID, LEVOTHROID) 50 MCG tablet, Take 50 mcg by mouth daily., Disp: , Rfl: ;  SUMAtriptan (IMITREX) 100 MG tablet, Take 1 tablet (100 mg total) by mouth every 2 (two) hours as needed for migraine., Disp: 10 tablet, Rfl: 5 triamcinolone ointment (KENALOG) 0.1 %, Apply 1 application topically 2 (two) times daily., Disp: 30 g, Rfl: 0  EXAM:  Filed Vitals:   07/25/14 0919  BP: 110/82  Pulse: 61  Temp: 98.4 F (36.9 C)    Body mass index is 25.13 kg/(m^2).  GENERAL: vitals reviewed and listed above, alert, oriented, appears well  hydrated and in no acute distress  HEENT: atraumatic, conjunttiva clear, no obvious abnormalities on inspection of external nose and ears  NECK: no obvious masses on inspection  LUNGS: clear to auscultation bilaterally, no wheezes, rales or rhonchi, good air movement  CV: HRRR, no peripheral edema  MS: moves all extremities without noticeable abnormality  PSYCH: pleasant and cooperative, no obvious depression or anxiety  ASSESSMENT AND PLAN:  Discussed the following assessment and plan:  Rash and nonspecific skin eruption - Plan: triamcinolone ointment (KENALOG) 0.1 % -we discussed possible serious and likely etiologies, workup and treatment, treatment risks and return precautions - this looks like eczema, however discussed potential for other etiologies and opted for close follow up and further eval if needed -follow up advised 1 month -of course, we advised Keoni  to return or notify a doctor immediately if symptoms worsen or persist or new concerns arise.  Migraine without status migrainosus, not intractable, unspecified migraine type - Plan: SUMAtriptan (IMITREX) 100 MG tablet  -Patient advised to return or notify a doctor immediately if symptoms worsen or persist or new concerns arise.  Patient Instructions  For Rash: -try triamcinilone cream twice daily -CERAVE CREAM twice daily -aquafor at work and avoid water as much as possible at home and work  Follow up  in 1 month - schedule New Patient Visit if you will be transferring care.     Colin Benton R.

## 2014-09-20 ENCOUNTER — Encounter: Payer: Self-pay | Admitting: Family Medicine

## 2014-09-20 ENCOUNTER — Ambulatory Visit (INDEPENDENT_AMBULATORY_CARE_PROVIDER_SITE_OTHER): Payer: 59 | Admitting: Family Medicine

## 2014-09-20 VITALS — BP 102/80 | HR 72 | Temp 98.0°F | Ht 71.0 in | Wt 185.5 lb

## 2014-09-20 DIAGNOSIS — Z131 Encounter for screening for diabetes mellitus: Secondary | ICD-10-CM

## 2014-09-20 DIAGNOSIS — L309 Dermatitis, unspecified: Secondary | ICD-10-CM | POA: Insufficient documentation

## 2014-09-20 DIAGNOSIS — G43009 Migraine without aura, not intractable, without status migrainosus: Secondary | ICD-10-CM

## 2014-09-20 DIAGNOSIS — Z13220 Encounter for screening for lipoid disorders: Secondary | ICD-10-CM

## 2014-09-20 DIAGNOSIS — G44009 Cluster headache syndrome, unspecified, not intractable: Secondary | ICD-10-CM

## 2014-09-20 DIAGNOSIS — K219 Gastro-esophageal reflux disease without esophagitis: Secondary | ICD-10-CM

## 2014-09-20 DIAGNOSIS — E039 Hypothyroidism, unspecified: Secondary | ICD-10-CM

## 2014-09-20 DIAGNOSIS — E041 Nontoxic single thyroid nodule: Secondary | ICD-10-CM

## 2014-09-20 LAB — LIPID PANEL
CHOLESTEROL: 171 mg/dL (ref 0–200)
HDL: 62.9 mg/dL (ref >39.00)
LDL Cholesterol: 97 mg/dL (ref 0–99)
NonHDL: 108.1
TRIGLYCERIDES: 57 mg/dL (ref 0.0–149.0)
Total CHOL/HDL Ratio: 3
VLDL: 11.4 mg/dL (ref 0.0–40.0)

## 2014-09-20 LAB — HEMOGLOBIN A1C: Hgb A1c MFr Bld: 6 % (ref 4.6–6.5)

## 2014-09-20 MED ORDER — ONDANSETRON 4 MG PO TBDP: 4.0000 mg | ORAL_TABLET | Freq: Three times a day (TID) | ORAL | Status: DC | PRN

## 2014-09-20 NOTE — Patient Instructions (Addendum)
BEFORE YOU LEAVE: -fasting labs -schedule physical in 3 months  Prilosec daily for 2-4 weeks and dietary recs for GERD - let me know if persist and can put referral for gastroenterologist  Non latex gloves for hands  We recommend the following healthy lifestyle measures: - eat a healthy diet consisting of lots of vegetables, fruits, beans, nuts, seeds, healthy meats such as white chicken and fish and whole grains.  - avoid fried foods, fast food, processed foods, sodas, red meet and other fattening foods.  - get a least 150 minutes of aerobic exercise per week.   Schedule colonoscopy  Food Choices for Gastroesophageal Reflux Disease When you have gastroesophageal reflux disease (GERD), the foods you eat and your eating habits are very important. Choosing the right foods can help ease the discomfort of GERD. WHAT GENERAL GUIDELINES DO I NEED TO FOLLOW?  Choose fruits, vegetables, whole grains, low-fat dairy products, and low-fat meat, fish, and poultry.  Limit fats such as oils, salad dressings, butter, nuts, and avocado.  Keep a food diary to identify foods that cause symptoms.  Avoid foods that cause reflux. These may be different for different people.  Eat frequent small meals instead of three large meals each day.  Eat your meals slowly, in a relaxed setting.  Limit fried foods.  Cook foods using methods other than frying.  Avoid drinking alcohol.  Avoid drinking large amounts of liquids with your meals.  Avoid bending over or lying down until 2-3 hours after eating. WHAT FOODS ARE NOT RECOMMENDED? The following are some foods and drinks that may worsen your symptoms: Vegetables Tomatoes. Tomato juice. Tomato and spaghetti sauce. Chili peppers. Onion and garlic. Horseradish. Fruits Oranges, grapefruit, and lemon (fruit and juice). Meats High-fat meats, fish, and poultry. This includes hot dogs, ribs, ham, sausage, salami, and bacon. Dairy Whole milk and chocolate  milk. Sour cream. Cream. Butter. Ice cream. Cream cheese.  Beverages Coffee and tea, with or without caffeine. Carbonated beverages or energy drinks. Condiments Hot sauce. Barbecue sauce.  Sweets/Desserts Chocolate and cocoa. Donuts. Peppermint and spearmint. Fats and Oils High-fat foods, including [place] fries and potato chips. Other Vinegar. Strong spices, such as black pepper, white pepper, red pepper, cayenne, curry powder, cloves, ginger, and chili powder. The items listed above may not be a complete list of foods and beverages to avoid. Contact your dietitian for more information. Document Released: 08/24/2005 Document Revised: 08/29/2013 Document Reviewed: 06/28/2013 [facility] Patient Information 2015 [place], [place]. This information is not intended to replace advice given to you by your health care provider. Make sure you discuss any questions you have with your health care provider.

## 2014-09-20 NOTE — Progress Notes (Signed)
Pre visit review using our clinic review tool, if applicable. No additional management support is needed unless otherwise documented below in the visit note. 

## 2014-09-20 NOTE — Progress Notes (Signed)
HPI:  Elizabeth Obrien is here to establish care.  Last PCP and physical: has not had a pap in 5 years   Rash on Hands: -improving significantly -but worse when uses latex gloves -itchy when flares -denies: malaise, rash elsewhere  Hypothyroidism: -sees Dr. Bubba Camp -TSH checked with Dr. Bubba Camp  GERD: -started about 1 year or so ago with increased stress -symptoms: heartburn, burning in throat at night -denies: dysphagia, vomiting, nausea  Migraines: -chronic, has seen multiple HA specialists in the past -takes imitrex when bad and works ok -much less frequently since menopause -wants zofran for nausea with these -denies: change, vision loss, weight loss, fevrs  Has the following chronic problems that require follow up and concerns today:  HM: -pap - will schedule physical -colon - wants to see gessner, does not want to see Dr. Deatra Ina - reports she will schedule - gets every 5 years due to [place] colon ca in father  ROS negative for unless reported above: fevers, unintentional weight loss, hearing or vision loss, chest pain, palpitations, struggling to breath, hemoptysis, melena, hematochezia, hematuria, falls, loc, si, thoughts of self harm  Past Medical History  Diagnosis Date  . Migraine   . Thyroid nodule 2011  . Hypothyroidism, iatrogenic     Follow by Dr. Bubba Camp    Past Surgical History  Procedure Laterality Date  . Right knee  2004    arthroscopic  . Breast surgery  1999    augmentation - saline  . Thyroid surgery  2011    Family History  Problem Relation Age of Onset  . Cancer Mother 30    breast, stomach ca  . Cancer Father     colon  . Cancer Other     thyroid    History   Social History  . Marital Status: Married    Spouse Name: N/A    Number of Children: 2  . Years of Education: N/A   Occupational History  . [employer]   Social History Main Topics  . Smoking status: Former Research scientist (life sciences)  . Smokeless tobacco: None     Comment:  smoked for 10 years, quit in 2002  . Alcohol Use: Yes     Comment: 1 drink every week  . Drug Use: None  . Sexual Activity: None   Other Topics Concern  . 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  levothyroxine (SYNTHROID, LEVOTHROID) 75 MCG tablet, Take 75 mcg by mouth daily before breakfast., Disp: , Rfl:  .  ondansetron (ZOFRAN ODT) 4 MG disintegrating tablet, Take 1 tablet (4 mg total) by mouth every 8 (eight) hours as needed for nausea or vomiting., Disp: 20 tablet, Rfl: 0 .  aspirin-acetaminophen-caffeine (EXCEDRIN MIGRAINE) 250-250-65 MG per tablet, Take by mouth every 6 (six) hours as needed for headache., Disp: , Rfl:  .  SUMAtriptan (IMITREX) 100 MG tablet, Take 1 tablet (100 mg total) by mouth every 2 (two) hours as needed for migraine. (Patient not taking: Reported on 09/20/2014), Disp: 10 tablet, Rfl: 5 .  triamcinolone ointment (KENALOG) 0.1 %, Apply 1 application topically 2 (two) times daily. (Patient not taking: Reported on 09/20/2014), Disp: 30 g, Rfl: 0  EXAM:  Filed Vitals:   09/20/14 0849  BP: 102/80  Pulse: 72  Temp: 98 F (36.7 C)    Body mass index is 25.88 kg/(m^2).  GENERAL: vitals reviewed and listed above, alert, oriented, appears well hydrated and in no acute distress  HEENT: atraumatic, conjunttiva clear, no obvious abnormalities on inspection of external nose and ears  NECK: no obvious masses on inspection  LUNGS: clear to auscultation bilaterally, no wheezes, rales or rhonchi, good air movement  CV: HRRR, no peripheral edema  ABD: BS+, soft, nttp  SKIN: very faint papular rash palm  MS: moves all extremities without noticeable abnormality  PSYCH: pleasant and cooperative, no obvious depression or anxiety  ASSESSMENT AND  PLAN:  Discussed the following assessment and plan:  Cluster headache, not intractable, unspecified chronicity pattern Migraine without aura and without status migrainosus, not intractable - Plan: ondansetron (ZOFRAN ODT) 4 MG disintegrating tablet - trial zofran per her request for nausea/vomiting with bad migraines  Gastroesophageal reflux disease, esophagitis presence not specified -ppi for 2-4 weeks, she is to call if not resolved and advised add EGD   THYROID NODULE, LEFT Hypothyroidism, unspecified hypothyroidism type -managed by endocrine  Eczema of both hands -improved sig, advised derm if does not resolve with following tx recs, avoiding latex  Screening cholesterol level - Plan: Lipid Panel  Diabetes mellitus screening - Plan: Hemoglobin A1c  -We reviewed the PMH, PSH, FH, SH, Meds and Allergies. -We provided refills for any medications we will prescribe as needed. -We addressed current concerns per orders and patient instructions. -We have asked for records for pertinent exams, studies, vaccines and notes from previous providers. -We have advised patient to follow up per instructions below.   -Patient advised to return or notify a doctor immediately if symptoms worsen or persist or new concerns arise.  Patient Instructions  BEFORE YOU LEAVE: -fasting labs -schedule physical in 3 months  Prilosec daily for 2-4 weeks and dietary recs for GERD - let me know if persist and can put referral for gastroenterologist  Non latex gloves for hands  We recommend the following healthy lifestyle measures: - eat a healthy diet consisting of lots of vegetables, fruits, beans, nuts, seeds, healthy meats such as white chicken and fish and whole grains.  - avoid fried foods, fast food, processed foods, sodas, red meet and other fattening foods.  - get a least 150 minutes of aerobic exercise per week.   Schedule colonoscopy  Food Choices for Gastroesophageal Reflux Disease When  you have gastroesophageal reflux disease (GERD), the foods you eat and your eating habits are very important. Choosing the right foods can help ease the discomfort of GERD. WHAT GENERAL GUIDELINES DO I NEED TO FOLLOW?  Choose fruits, vegetables, whole grains, low-fat dairy products, and low-fat meat, fish, and poultry.  Limit fats such as oils, salad dressings, butter, nuts, and avocado.  Keep a food diary to identify foods that cause symptoms.  Avoid foods that cause reflux. These may be different for different people.  Eat frequent small meals instead of three large meals each day.  Eat your meals slowly, in a relaxed setting.  Limit fried foods.  Cook foods using methods other than frying.  Avoid drinking alcohol.  Avoid drinking large amounts of liquids with your meals.  Avoid bending over or lying down until 2-3 hours after eating. WHAT FOODS ARE NOT RECOMMENDED? The following are some foods and drinks that may worsen your symptoms: Vegetables Tomatoes. Tomato juice. Tomato and spaghetti  sauce. Chili peppers. Onion and garlic. Horseradish. Fruits Oranges, grapefruit, and lemon (fruit and juice). Meats High-fat meats, fish, and poultry. This includes hot dogs, ribs, ham, sausage, salami, and bacon. Dairy Whole milk and chocolate milk. Sour cream. Cream. Butter. Ice cream. Cream cheese.  Beverages Coffee and tea, with or without caffeine. Carbonated beverages or energy drinks. Condiments Hot sauce. Barbecue sauce.  Sweets/Desserts Chocolate and cocoa. Donuts. Peppermint and spearmint. Fats and Oils High-fat foods, including [place] fries and potato chips. Other Vinegar. Strong spices, such as black pepper, white pepper, red pepper, cayenne, curry powder, cloves, ginger, and chili powder. The items listed above may not be a complete list of foods and beverages to avoid. Contact your dietitian for more information. Document Released: 08/24/2005 Document Revised:  08/29/2013 Document Reviewed: 06/28/2013 [facility] Patient Information 2015 [place], [place]. This information is not intended to replace advice given to you by your health care provider. Make sure you discuss any questions you have with your health care provider.      Colin Benton R.

## 2014-10-27 ENCOUNTER — Encounter: Payer: Self-pay | Admitting: Gastroenterology

## 2014-11-26 ENCOUNTER — Other Ambulatory Visit: Payer: Self-pay | Admitting: Orthopedic Surgery

## 2014-11-26 ENCOUNTER — Encounter (HOSPITAL_COMMUNITY): Payer: Self-pay | Admitting: *Deleted

## 2014-11-26 MED ORDER — SODIUM CHLORIDE 0.45 % IV SOLN: INTRAVENOUS | Status: DC

## 2014-11-26 MED ORDER — CHLORHEXIDINE GLUCONATE 4 % EX LIQD
60.0000 mL | Freq: Once | CUTANEOUS | Status: DC
Start: 2014-11-26 — End: 2014-11-27
  Filled 2014-11-26: qty 60

## 2014-11-26 MED ORDER — CEFAZOLIN SODIUM-DEXTROSE 2-3 GM-% IV SOLR
2.0000 g | INTRAVENOUS | Status: AC
  Administered 2014-11-27: 2 g via INTRAVENOUS
  Filled 2014-11-26: qty 50

## 2014-11-27 ENCOUNTER — Ambulatory Visit (HOSPITAL_COMMUNITY): Payer: 59 | Admitting: Anesthesiology

## 2014-11-27 ENCOUNTER — Ambulatory Visit (HOSPITAL_COMMUNITY)
Admission: RE | Admit: 2014-11-27 | Discharge: 2014-11-27 | Disposition: A | Discharge status: Home / Self Care | Payer: 59 | Source: Ambulatory Visit | Attending: Orthopedic Surgery | Admitting: Orthopedic Surgery

## 2014-11-27 ENCOUNTER — Encounter (HOSPITAL_COMMUNITY)
Admission: RE | Disposition: A | Discharge status: Home / Self Care | Payer: Self-pay | Source: Ambulatory Visit | Attending: Orthopedic Surgery

## 2014-11-27 DIAGNOSIS — Z7982 Long term (current) use of aspirin: Secondary | ICD-10-CM | POA: Diagnosis not present

## 2014-11-27 DIAGNOSIS — Z87891 Personal history of nicotine dependence: Secondary | ICD-10-CM | POA: Diagnosis not present

## 2014-11-27 DIAGNOSIS — S52571A Other intraarticular fracture of lower end of right radius, initial encounter for closed fracture: Secondary | ICD-10-CM | POA: Diagnosis not present

## 2014-11-27 DIAGNOSIS — K219 Gastro-esophageal reflux disease without esophagitis: Secondary | ICD-10-CM | POA: Insufficient documentation

## 2014-11-27 DIAGNOSIS — W19XXXA Unspecified fall, initial encounter: Secondary | ICD-10-CM | POA: Insufficient documentation

## 2014-11-27 DIAGNOSIS — E039 Hypothyroidism, unspecified: Secondary | ICD-10-CM | POA: Diagnosis not present

## 2014-11-27 DIAGNOSIS — L309 Dermatitis, unspecified: Secondary | ICD-10-CM | POA: Diagnosis not present

## 2014-11-27 DIAGNOSIS — Z79899 Other long term (current) drug therapy: Secondary | ICD-10-CM | POA: Diagnosis not present

## 2014-11-27 DIAGNOSIS — E032 Hypothyroidism due to medicaments and other exogenous substances: Secondary | ICD-10-CM | POA: Insufficient documentation

## 2014-11-27 HISTORY — PX: OPEN REDUCTION INTERNAL FIXATION (ORIF) DISTAL RADIAL FRACTURE: SHX5989

## 2014-11-27 HISTORY — DX: Gastro-esophageal reflux disease without esophagitis: K21.9

## 2014-11-27 LAB — CBC
HCT: 43.9 % (ref 36.0–46.0)
HEMOGLOBIN: 14.8 g/dL (ref 12.0–15.0)
MCH: 30.5 pg (ref 26.0–34.0)
MCHC: 33.7 g/dL (ref 30.0–36.0)
MCV: 90.5 fL (ref 78.0–100.0)
Platelets: 246 10*3/uL (ref 150–400)
RBC: 4.85 MIL/uL (ref 3.87–5.11)
RDW: 12.1 % (ref 11.5–15.5)
WBC: 6.2 10*3/uL (ref 4.0–10.5)

## 2014-11-27 SURGERY — OPEN REDUCTION INTERNAL FIXATION (ORIF) DISTAL RADIUS FRACTURE
Anesthesia: General | Site: Arm Lower | Laterality: Right

## 2014-11-27 MED ORDER — FENTANYL CITRATE 0.05 MG/ML IJ SOLN
INTRAMUSCULAR | Status: DC | PRN
  Administered 2014-11-27: 100 ug via INTRAVENOUS

## 2014-11-27 MED ORDER — ONDANSETRON HCL 4 MG/2ML IJ SOLN: 4.0000 mg | Freq: Once | INTRAMUSCULAR | Status: DC | PRN

## 2014-11-27 MED ORDER — PHENYLEPHRINE HCL 10 MG/ML IJ SOLN
INTRAMUSCULAR | Status: DC | PRN
  Administered 2014-11-27 (×3): 80 ug via INTRAVENOUS

## 2014-11-27 MED ORDER — LACTATED RINGERS IV SOLN
INTRAVENOUS | Status: DC
  Administered 2014-11-27: 14:00:00 via INTRAVENOUS

## 2014-11-27 MED ORDER — 0.9 % SODIUM CHLORIDE (POUR BTL) OPTIME
TOPICAL | Status: DC | PRN
  Administered 2014-11-27: 1000 mL

## 2014-11-27 MED ORDER — EPHEDRINE SULFATE 50 MG/ML IJ SOLN
INTRAMUSCULAR | Status: DC | PRN
  Administered 2014-11-27 (×2): 5 mg via INTRAVENOUS

## 2014-11-27 MED ORDER — OXYCODONE HCL 5 MG PO TABS
5.0000 mg | ORAL_TABLET | Freq: Once | ORAL | Status: AC | PRN
  Administered 2014-11-27: 5 mg via ORAL

## 2014-11-27 MED ORDER — LACTATED RINGERS IV SOLN
INTRAVENOUS | Status: DC | PRN
  Administered 2014-11-27 (×2): via INTRAVENOUS

## 2014-11-27 MED ORDER — MEPERIDINE HCL 25 MG/ML IJ SOLN
INTRAMUSCULAR | Status: AC
  Administered 2014-11-27: 6.25 mg
  Filled 2014-11-27: qty 1

## 2014-11-27 MED ORDER — FENTANYL CITRATE 0.05 MG/ML IJ SOLN
INTRAMUSCULAR | Status: AC
  Filled 2014-11-27: qty 5

## 2014-11-27 MED ORDER — ONDANSETRON HCL 4 MG/2ML IJ SOLN
INTRAMUSCULAR | Status: AC
  Filled 2014-11-27: qty 2

## 2014-11-27 MED ORDER — HYDROMORPHONE HCL 1 MG/ML IJ SOLN: 0.2500 mg | INTRAMUSCULAR | Status: DC | PRN

## 2014-11-27 MED ORDER — MEPERIDINE HCL 25 MG/ML IJ SOLN
6.2500 mg | Freq: Once | INTRAMUSCULAR | Status: AC
  Administered 2014-11-27: 6.25 mg via INTRAVENOUS

## 2014-11-27 MED ORDER — DEXAMETHASONE SODIUM PHOSPHATE 4 MG/ML IJ SOLN
INTRAMUSCULAR | Status: AC
  Filled 2014-11-27: qty 1

## 2014-11-27 MED ORDER — PROPOFOL 10 MG/ML IV BOLUS
INTRAVENOUS | Status: DC | PRN
  Administered 2014-11-27: 200 mg via INTRAVENOUS

## 2014-11-27 MED ORDER — LIDOCAINE HCL (CARDIAC) 20 MG/ML IV SOLN
INTRAVENOUS | Status: DC | PRN
  Administered 2014-11-27: 20 mg via INTRAVENOUS

## 2014-11-27 MED ORDER — OXYCODONE HCL 5 MG/5ML PO SOLN: 5.0000 mg | Freq: Once | ORAL | Status: AC | PRN

## 2014-11-27 MED ORDER — MIDAZOLAM HCL 2 MG/2ML IJ SOLN
INTRAMUSCULAR | Status: AC
  Administered 2014-11-27: 2 mg
  Filled 2014-11-27: qty 2

## 2014-11-27 MED ORDER — CEPHALEXIN 500 MG PO CAPS: 500.0000 mg | ORAL_CAPSULE | Freq: Four times a day (QID) | ORAL | Status: DC

## 2014-11-27 MED ORDER — DEXAMETHASONE SODIUM PHOSPHATE 10 MG/ML IJ SOLN
INTRAMUSCULAR | Status: DC | PRN
  Administered 2014-11-27: 4 mg via INTRAVENOUS

## 2014-11-27 MED ORDER — ONDANSETRON HCL 4 MG/2ML IJ SOLN
INTRAMUSCULAR | Status: DC | PRN
  Administered 2014-11-27: 4 mg via INTRAVENOUS

## 2014-11-27 MED ORDER — OXYCODONE HCL 5 MG PO TABS
ORAL_TABLET | ORAL | Status: DC
Start: 2014-11-27 — End: 2014-11-27
  Filled 2014-11-27: qty 1

## 2014-11-27 MED ORDER — FENTANYL CITRATE 0.05 MG/ML IJ SOLN
INTRAMUSCULAR | Status: AC
  Administered 2014-11-27: 100 ug
  Filled 2014-11-27: qty 2

## 2014-11-27 MED ORDER — PHENYLEPHRINE 40 MCG/ML (10ML) SYRINGE FOR IV PUSH (FOR BLOOD PRESSURE SUPPORT)
PREFILLED_SYRINGE | INTRAVENOUS | Status: AC
  Filled 2014-11-27: qty 10

## 2014-11-27 SURGICAL SUPPLY — 68 items
BANDAGE ELASTIC 3 VELCRO ST LF (GAUZE/BANDAGES/DRESSINGS) ×3 IMPLANT
BANDAGE ELASTIC 4 VELCRO ST LF (GAUZE/BANDAGES/DRESSINGS) ×3 IMPLANT
BIT DRILL 2.2 SS TIBIAL (BIT) ×2 IMPLANT
BLADE SURG ROTATE 9660 (MISCELLANEOUS) IMPLANT
BNDG CMPR 9X4 STRL LF SNTH (GAUZE/BANDAGES/DRESSINGS) ×1
BNDG ESMARK 4X9 LF (GAUZE/BANDAGES/DRESSINGS) ×3 IMPLANT
BNDG GAUZE ELAST 4 BULKY (GAUZE/BANDAGES/DRESSINGS) ×5 IMPLANT
CANISTER SUCTION 2500CC (MISCELLANEOUS) ×2 IMPLANT
CORDS BIPOLAR (ELECTRODE) ×3 IMPLANT
COVER SURGICAL LIGHT HANDLE (MISCELLANEOUS) ×3 IMPLANT
CUFF TOURNIQUET SINGLE 18IN (TOURNIQUET CUFF) ×3 IMPLANT
CUFF TOURNIQUET SINGLE 24IN (TOURNIQUET CUFF) IMPLANT
DECANTER SPIKE VIAL GLASS SM (MISCELLANEOUS) IMPLANT
DRAIN TLS ROUND 10FR (DRAIN) IMPLANT
DRAPE INCISE IOBAN 66X45 STRL (DRAPES) ×2 IMPLANT
DRAPE OEC MINIVIEW 54X84 (DRAPES) ×2 IMPLANT
DRAPE U-SHAPE 47X51 STRL (DRAPES) ×3 IMPLANT
DRSG ADAPTIC 3X8 NADH LF (GAUZE/BANDAGES/DRESSINGS) ×2 IMPLANT
GAUZE SPONGE 4X4 12PLY STRL (GAUZE/BANDAGES/DRESSINGS) ×3 IMPLANT
GAUZE XEROFORM 1X8 LF (GAUZE/BANDAGES/DRESSINGS) ×3 IMPLANT
GLOVE ORTHO TXT STRL SZ7.5 (GLOVE) ×3 IMPLANT
GLOVE SS BIOGEL STRL SZ 8 (GLOVE) ×1 IMPLANT
GLOVE SUPERSENSE BIOGEL SZ 8 (GLOVE) ×2
GOWN STRL REUS W/ TWL LRG LVL3 (GOWN DISPOSABLE) ×3 IMPLANT
GOWN STRL REUS W/ TWL XL LVL3 (GOWN DISPOSABLE) ×3 IMPLANT
GOWN STRL REUS W/TWL LRG LVL3 (GOWN DISPOSABLE) ×6
GOWN STRL REUS W/TWL XL LVL3 (GOWN DISPOSABLE) ×3
KIT BASIN OR (CUSTOM PROCEDURE TRAY) ×3 IMPLANT
KIT ROOM TURNOVER OR (KITS) ×3 IMPLANT
LOOP VESSEL MAXI BLUE (MISCELLANEOUS) IMPLANT
MANIFOLD NEPTUNE II (INSTRUMENTS) ×1 IMPLANT
NEEDLE 22X1 1/2 (OR ONLY) (NEEDLE) IMPLANT
NS IRRIG 1000ML POUR BTL (IV SOLUTION) ×3 IMPLANT
PACK ORTHO EXTREMITY (CUSTOM PROCEDURE TRAY) ×3 IMPLANT
PAD ARMBOARD 7.5X6 YLW CONV (MISCELLANEOUS) ×6 IMPLANT
PAD CAST 3X4 CTTN HI CHSV (CAST SUPPLIES) IMPLANT
PAD CAST 4YDX4 CTTN HI CHSV (CAST SUPPLIES) ×1 IMPLANT
PADDING CAST COTTON 3X4 STRL (CAST SUPPLIES) ×3
PADDING CAST COTTON 4X4 STRL (CAST SUPPLIES) ×3
PEG LOCKING SMOOTH 2.2X18 (Peg) ×2 IMPLANT
PEG LOCKING SMOOTH 2.2X20 (Screw) ×2 IMPLANT
PEG LOCKING SMOOTH 2.2X22 (Screw) ×2 IMPLANT
PLATE STAN 21.6X57.2 NRRW RT (Plate) ×2 IMPLANT
PUTTY DBM STAGRAFT PLUS 2CC (Putty) ×2 IMPLANT
SCREW LOCK 14X2.7X 3 LD TPR (Screw) IMPLANT
SCREW LOCK 18X2.7X 3 LD TPR (Screw) IMPLANT
SCREW LOCK 20X2.7X 3 LD TPR (Screw) IMPLANT
SCREW LOCK 22X2.7X 3 LD TPR (Screw) IMPLANT
SCREW LOCKING 2.7X14 (Screw) ×6 IMPLANT
SCREW LOCKING 2.7X15MM (Screw) ×2 IMPLANT
SCREW LOCKING 2.7X18 (Screw) ×3 IMPLANT
SCREW LOCKING 2.7X20MM (Screw) ×3 IMPLANT
SCREW LOCKING 2.7X22MM (Screw) ×3 IMPLANT
SPONGE GAUZE 4X4 12PLY STER LF (GAUZE/BANDAGES/DRESSINGS) ×2 IMPLANT
SPONGE LAP 4X18 X RAY DECT (DISPOSABLE) IMPLANT
SUT MNCRL AB 4-0 PS2 18 (SUTURE) ×3 IMPLANT
SUT PROLENE 3 0 PS 2 (SUTURE) ×2 IMPLANT
SUT PROLENE 4 0 PS 2 18 (SUTURE) ×2 IMPLANT
SUT VIC AB 3-0 FS2 27 (SUTURE) ×2 IMPLANT
SYR CONTROL 10ML LL (SYRINGE) IMPLANT
SYSTEM CHEST DRAIN TLS 7FR (DRAIN) ×2 IMPLANT
TOWEL OR 17X24 6PK STRL BLUE (TOWEL DISPOSABLE) ×3 IMPLANT
TOWEL OR 17X26 10 PK STRL BLUE (TOWEL DISPOSABLE) ×3 IMPLANT
TUBE CONNECTING 12'X1/4 (SUCTIONS) ×1
TUBE CONNECTING 12X1/4 (SUCTIONS) ×2 IMPLANT
TUBE EVACUATION TLS (MISCELLANEOUS) ×1 IMPLANT
UNDERPAD 30X30 INCONTINENT (UNDERPADS AND DIAPERS) ×3 IMPLANT
WATER STERILE IRR 1000ML POUR (IV SOLUTION) ×1 IMPLANT

## 2014-11-27 NOTE — Op Note (Signed)
See [id number] Amedeo Plenty Md

## 2014-11-27 NOTE — Anesthesia Preprocedure Evaluation (Addendum)
Anesthesia Evaluation  Patient identified by MRN, date of birth, ID band Patient awake    Reviewed: Allergy & Precautions, NPO status , Patient's Chart, lab work & pertinent test results  Airway Mallampati: I  TM Distance: >3 FB Neck ROM: Full    Dental  (+) Teeth Intact, Dental Advisory Given   Pulmonary former smoker,  breath sounds clear to auscultation        Cardiovascular Rhythm:Regular Rate:Normal     Neuro/Psych    GI/Hepatic GERD-  Medicated and Controlled,  Endo/Other    Renal/GU      Musculoskeletal   Abdominal   Peds  Hematology   Anesthesia Other Findings   Reproductive/Obstetrics                            Anesthesia Physical Anesthesia Plan  ASA: II  Anesthesia Plan: General   Post-op Pain Management:    Induction: Intravenous  Airway Management Planned: LMA  Additional Equipment:   Intra-op Plan:   Post-operative Plan: Extubation in OR  Informed Consent: I have reviewed the patients History and Physical, chart, labs and discussed the procedure including the risks, benefits and alternatives for the proposed anesthesia with the patient or authorized representative who has indicated his/her understanding and acceptance.   Dental advisory given  Plan Discussed with: CRNA, Anesthesiologist and Surgeon  Anesthesia Plan Comments:         Anesthesia Quick Evaluation

## 2014-11-27 NOTE — Transfer of Care (Signed)
Immediate Anesthesia Transfer of Care Note  Patient: Elizabeth Obrien  Procedure(s) Performed: Procedure(s): OPEN REDUCTION INTERNAL FIXATION (ORIF) RIGHT  DISTAL RADIAL FRACTURE WITH ALLOGRAFT BONE GRAFTING AND REPAIR (Right)  Patient Location: PACU  Anesthesia Type:General  Level of Consciousness: awake, alert  and oriented  Airway & Oxygen Therapy: Patient Spontanous Breathing  Post-op Assessment: Report given to RN, Post -op Vital signs reviewed and stable and Patient moving all extremities  Post vital signs: Reviewed and stable  Last Vitals:  Filed Vitals:   11/27/14 1558  BP:   Pulse: 83  Temp:   Resp: 19    Complications: No apparent anesthesia complications

## 2014-11-27 NOTE — Progress Notes (Signed)
Orthopedic Tech Progress Note Patient Details:  Elizabeth Obrien Jan 19, 1961 [id number] Applied arm sling to RUE. Ortho Devices Type of Ortho Device: Arm sling Ortho Device/Splint Location: RUE Ortho Device/Splint Interventions: Application   Darrol Poke 11/27/2014, 7:32 PM

## 2014-11-27 NOTE — H&P (Signed)
Elizabeth Obrien is an 54 y.o. female.   Chief Complaint: right wrist fracture HPI: The patient is very pleasant 54 year old female presents today for surgical intervention regards to the right upper extremity. She fell while visiting in [place] sustaining a right distal radius fracture comminuted and displaced. She was seen at a local hospital and underwent reduction and splinting. She returns the states and follow back up with [place] in our office setting for any evaluation. We discussed with her all issues and our recommendations for surgical intervention given the high propensity for progressive angulation and collapse about the fracture site. After thorough discussion she desires surgical intervention. Preoperative laboratory data was obtained and reviewed. Questions encouraged and answered preoperatively.  Past Medical History  Diagnosis Date  . Migraine   . Thyroid nodule 2011  . Hypothyroidism, iatrogenic     Follow by Dr. Bubba Camp  . GERD (gastroesophageal reflux disease)   . Eczema     Past Surgical History  Procedure Laterality Date  . Right knee  2004    arthroscopic  . Breast surgery  1999    augmentation - saline  . Thyroid surgery  2011  . Colonoscopy      Family History  Problem Relation Age of Onset  . Cancer Mother 33    breast, stomach ca  . Cancer Father     colon  . Cancer Other     thyroid   Social History:  reports that she has quit smoking. She has never used smokeless tobacco. She reports that she drinks alcohol. She reports that she does not use illicit drugs.  Allergies:  Allergies  Allergen Reactions  . Other Hives and Rash    Topical antibiotics    Medications Prior to Admission  Medication Sig Dispense Refill  . Ascorbic Acid (VITAMIN C) 1000 MG tablet Take 1,000 mg by mouth daily.    Marland Kitchen aspirin-acetaminophen-caffeine (EXCEDRIN MIGRAINE) 250-250-65 MG per tablet Take by mouth every 6 (six) hours as needed for headache.    . famotidine (PEPCID) 10  MG tablet Take 10 mg by mouth daily as needed for heartburn or indigestion.    Marland Kitchen levothyroxine (SYNTHROID, LEVOTHROID) 75 MCG tablet Take 75 mcg by mouth daily before breakfast.    . MAGNESIUM PO Take 600 mg by mouth at bedtime.    . methocarbamol (ROBAXIN) 500 MG tablet Take 500 mg by mouth every 6 (six) hours.    . ondansetron (ZOFRAN ODT) 4 MG disintegrating tablet Take 1 tablet (4 mg total) by mouth every 8 (eight) hours as needed for nausea or vomiting. 20 tablet 0  . oxyCODONE (OXY IR/ROXICODONE) 5 MG immediate release tablet Take 5 mg by mouth every 4 (four) hours as needed for moderate pain or severe pain.    . SUMAtriptan (IMITREX) 100 MG tablet Take 1 tablet (100 mg total) by mouth every 2 (two) hours as needed for migraine. 10 tablet 5  . triamcinolone ointment (KENALOG) 0.1 % Apply 1 application topically 2 (two) times daily. 30 g 0    Results for orders placed or performed during the hospital encounter of 11/27/14 (from the past 48 hour(s))  CBC     Status: None   Collection Time: 11/27/14  2:10 PM  Result Value Ref Range   WBC 6.2 4.0 - 10.5 K/uL   RBC 4.85 3.87 - 5.11 MIL/uL   Hemoglobin 14.8 12.0 - 15.0 g/dL   HCT 43.9 36.0 - 46.0 %   MCV 90.5 78.0 - 100.0 fL  MCH 30.5 26.0 - 34.0 pg   MCHC 33.7 30.0 - 36.0 g/dL   RDW 12.1 11.5 - 15.5 %   Platelets 246 150 - 400 K/uL   No results found.  Review of Systems  Constitutional: Negative.   HENT: Negative.   Eyes: Negative.   Respiratory: Negative.   Cardiovascular: Negative.   Gastrointestinal: Negative.   Genitourinary: Negative.   Skin: Negative.     Blood pressure 108/90, pulse 83, temperature 97.4 F (36.3 C), temperature source Oral, resp. rate 19, height 5\' 11"  (1.803 m), weight 81.647 kg (180 lb), SpO2 100 %. Physical Exam  Evaluation of the right upper extremity shows that her splint is clean, dry, intact. Digital range of motion is intact. Median nerve distribution is intact. No signs of compartment  syndrome are present. .The patient is alert and oriented in no acute distress the patient complains of pain in the affected upper extremity.  The patient is noted to have a normal HEENT exam.  Lung fields show equal chest expansion and no shortness of breath  abdomen exam is nontender without distention.  Lower extremity examination does not show any fracture dislocation or blood clot symptoms.  Pelvis is stable neck and back are stable and nontender  Assessment/Plan Right distal radius fracture Patient Active Problem List   Diagnosis Date Noted  . GERD (gastroesophageal reflux disease) 09/20/2014  . Hand eczema 09/20/2014  . THYROID NODULE, LEFT 12/04/2009  . Cluster headache syndrome 06/20/2008  . Migraine 01/13/2008   .We are planning surgery for your upper extremity. The risk and benefits of surgery include risk of bleeding infection anesthesia damage to normal structures and failure of the surgery to accomplish its intended goals of relieving symptoms and restoring function with this in mind we'll going to proceed. I have specifically discussed with the patient the pre-and postoperative regime and the does and don'ts and risk and benefits in great detail. Risk and benefits of surgery also include risk of dystrophy chronic nerve pain failure of the healing process to go onto completion and other inherent risks of surgery The relavent the pathophysiology of the disease/injury process, as well as the alternatives for treatment and postoperative course of action has been discussed in great detail with the patient who desires to proceed.  We will do everything in our power to help you (the patient) restore function to the upper extremity. Is a pleasure to see this patient today.  Jd Mccaster L 11/27/2014, 4:25 PM

## 2014-11-27 NOTE — Discharge Instructions (Signed)
.  Keep bandage clean and dry.  Call for any problems.  No smoking.  Criteria for driving a car: you should be off your pain medicine for 7-8 hours, able to drive one handed(confident), thinking clearly and feeling able in your judgement to drive. Continue elevation as it will decrease swelling.  If instructed by MD move your fingers within the confines of the bandage/splint.  Use ice if instructed by your MD. Call immediately for any sudden loss of feeling in your hand/arm or change in functional abilities of the extremity. .We recommend that you to take vitamin C 1000 mg a day to promote healing we also recommend that if you require her pain medicine that he take a stool softener to prevent constipation as most pain medicines will have constipation side effects. We recommend either Peri-Colace or Senokot and recommend that you also consider adding MiraLAX to prevent the constipation affects from pain medicine if you are required to use them. These medicines are over the counter and maybe purchased at a local pharmacy.

## 2014-11-27 NOTE — Anesthesia Procedure Notes (Addendum)
Procedure Name: LMA Insertion Date/Time: 11/27/2014 4:45 PM Performed by: Izora Gala Pre-anesthesia Checklist: Patient identified, Emergency Drugs available, Suction available, Patient being monitored and Timeout performed Patient Re-evaluated:Patient Re-evaluated prior to inductionOxygen Delivery Method: Circle system utilized Preoxygenation: Pre-oxygenation with 100% oxygen Intubation Type: IV induction Ventilation: Mask ventilation without difficulty LMA: LMA inserted LMA Size: 4.0 Number of attempts: 1 Placement Confirmation: positive ETCO2 and breath sounds checked- equal and bilateral Tube secured with: Tape Dental Injury: Teeth and Oropharynx as per pre-operative assessment    Anesthesia Regional Block:  Supraclavicular block  Pre-Anesthetic Checklist: ,, timeout performed, Correct Patient, Correct Site, Correct Laterality, Correct Procedure, Correct Position, site marked, Risks and benefits discussed,  Surgical consent,  Pre-op evaluation,  At surgeon's request and post-op pain management  Laterality: Right and Lower  Prep: chloraprep       Needles:  Injection technique: Single-shot  Needle Type: Echogenic Stimulator Needle     Needle Length: 5cm 5 cm Needle Gauge: 21 and 21 G    Additional Needles: Supraclavicular block Narrative:  Start time: 11/28/2014 2:55 PM End time: 11/28/2014 3:03 PM Injection made incrementally with aspirations every 5 mL.  Performed by: Personally  Anesthesiologist: Sherise Geerdes  Additional Notes: Technical problems prevented saving of [place] image.

## 2014-11-28 ENCOUNTER — Encounter (HOSPITAL_COMMUNITY): Payer: Self-pay | Admitting: Orthopedic Surgery

## 2014-11-28 MED ORDER — BUPIVACAINE-EPINEPHRINE (PF) 0.5% -1:200000 IJ SOLN
INTRAMUSCULAR | Status: DC | PRN
  Administered 2014-11-27: 20 mL via PERINEURAL

## 2014-11-28 NOTE — Op Note (Signed)
Elizabeth Obrien, Elizabeth Obrien NO.:  [id number]  MEDICAL RECORD NO.:  [id number]  LOCATION:  [facility]                         FACILITY:  [facility]  PHYSICIAN:  Dionne Ano. Reika Callanan, M.D.DATE OF BIRTH:  1960/12/25  DATE OF PROCEDURE: DATE OF DISCHARGE:  11/27/2014                              OPERATIVE REPORT   PREOPERATIVE DIAGNOSIS:  Comminuted complex greater than 3-part articular distal radius fracture, right upper extremity.  POSTOPERATIVE DIAGNOSIS:  Comminuted complex greater than 3-part articular distal radius fracture, right upper extremity.  PROCEDURE: 1. Open reduction and internal fixation with DVR narrow CrossLock     plate from [employer].  Right distal radius fracture greater than 3     part intra-articular and comminuted. 2. AP lateral and oblique x-ray performed and examined interpreted by     ourselves (Mr. Wynona Neat and Dr. Amanda Pea).  SURGEON:  Dionne Ano. Amanda Pea, MD  ASSISTANT:  Karie Chimera, PA-C  ESTIMATED BLOOD LOSS:  Minimal.  DRAINS:  One.  INDICATIONS:  A pleasant 54 year old female, who injured her wrist in [place], [place].  She is now greater than 2 weeks out.  She has comminuted complex greater than 3-part fracture and plans to proceed with ORIF.  I have discussed the risks, benefits, timeframe duration of recovery, and do's and don'ts.  With this in mind, she desires to proceed, all questions have been encouraged and answered preoperatively.  OPERATIVE PROCEDURE:  The patient was seen by myself and Anesthesia, taken to operative suite, block was by slightly inconsistent, thus she underwent LMA general anesthetic.  Following this, she was prepped and draped in usual sterile fashion with Hibiclens scrub followed by Betadine scrub.  Time-out was called.  Preoperative antibiotics were given.  Final time-out was secured.  Arm was elevated, tourniquet was insufflated and the patient had a volar radial incision made under sterile conditions.  Dissection  was carried down.  FCR tendon sheath was incised palmarly and dorsally.  Retraction was performed and following this, the pronator was incised.  Once pronator was incised, we then identified the fracture site.  We had to mobilize the fracture site given the timeframe duration from injury and we then placed 2 mL of stay graft from [employer] in the dorsal V defect.  Following this, we performed manipulative reduction, was able to achieve adequate radial height, inclination, and volar tilt to my satisfaction.  I was quite pleased with these findings.  Once this was complete, a DVR plate and screw construct with CrossLock technology was applied with standard technique. Radial inclination, volar tilt, and radial heights were recreated nicely.  Following this, distal radioulnar joint was assessed as was the radiocarpal and midcarpal joints.  She was generally stiff, but there were no immediate complicating features.  Following this, patient then underwent very careful and cautious approach to the extremity with irrigation followed by pronator closure followed by TLS drain placement. AP lateral and oblique x-rays were performed, exam interpreted by ourselves and deemed to be excellent.  We were pleased with these findings.  Skin edge was closed over the drain with Prolene and the patient had Adaptic followed by gauze and a sterile wrap placed followed by a fiberglass slabs dorsally and volarly.  She will be discharged home on Keflex for 7 days 500 q.i.d.  She will continue her OxyIR and Robaxin which she has at home.  She will notify us should any problems occur and discontinue her drain tomorrow.     Dionne Ano. Amanda Pea, M.D.     [facility]  D:  11/27/2014  T:  11/28/2014  Job:  [id number]

## 2014-11-28 NOTE — Addendum Note (Signed)
Addendum  created 11/28/14 0830 by Lorrene Reid, MD   Modules edited: Anesthesia Blocks and Procedures, Anesthesia Medication Administration, Clinical Notes   Clinical Notes:  File: [id number]

## 2014-11-28 NOTE — Anesthesia Postprocedure Evaluation (Signed)
  Anesthesia Post-op Note  Patient: Elizabeth Obrien  Procedure(s) Performed: Procedure(s): OPEN REDUCTION INTERNAL FIXATION (ORIF) RIGHT  DISTAL RADIAL FRACTURE WITH ALLOGRAFT BONE GRAFTING AND REPAIR (Right)  Patient Location: PACU  Anesthesia Type:GA combined with regional for post-op pain  Level of Consciousness: awake, alert , oriented and patient cooperative  Airway and Oxygen Therapy: Patient Spontanous Breathing  Post-op Pain: none  Post-op Assessment: Post-op Vital signs reviewed, Patient's Cardiovascular Status Stable, Respiratory Function Stable, Patent Airway, No signs of Nausea or vomiting and Pain level controlled, shivering relieved  Post-op Vital Signs: Reviewed and stable  Last Vitals:  Filed Vitals:   11/27/14 1915  BP:   Pulse: 74  Temp: 36.7 C  Resp: 16    Complications: No apparent anesthesia complications (delayed entry, pt eval in PACU post op)

## 2014-12-19 ENCOUNTER — Encounter: Payer: Self-pay | Admitting: Family Medicine

## 2014-12-19 ENCOUNTER — Ambulatory Visit (INDEPENDENT_AMBULATORY_CARE_PROVIDER_SITE_OTHER): Payer: 59 | Admitting: Family Medicine

## 2014-12-19 VITALS — BP 108/80 | HR 68 | Temp 97.7°F | Ht 70.25 in | Wt 180.3 lb

## 2014-12-19 DIAGNOSIS — G43009 Migraine without aura, not intractable, without status migrainosus: Secondary | ICD-10-CM

## 2014-12-19 DIAGNOSIS — K219 Gastro-esophageal reflux disease without esophagitis: Secondary | ICD-10-CM | POA: Diagnosis not present

## 2014-12-19 DIAGNOSIS — F419 Anxiety disorder, unspecified: Principal | ICD-10-CM

## 2014-12-19 DIAGNOSIS — R739 Hyperglycemia, unspecified: Secondary | ICD-10-CM | POA: Diagnosis not present

## 2014-12-19 DIAGNOSIS — F418 Other specified anxiety disorders: Secondary | ICD-10-CM | POA: Diagnosis not present

## 2014-12-19 DIAGNOSIS — R7303 Prediabetes: Secondary | ICD-10-CM

## 2014-12-19 DIAGNOSIS — G44009 Cluster headache syndrome, unspecified, not intractable: Secondary | ICD-10-CM | POA: Diagnosis not present

## 2014-12-19 DIAGNOSIS — E890 Postprocedural hypothyroidism: Secondary | ICD-10-CM | POA: Insufficient documentation

## 2014-12-19 DIAGNOSIS — R7309 Other abnormal glucose: Secondary | ICD-10-CM

## 2014-12-19 DIAGNOSIS — F329 Major depressive disorder, single episode, unspecified: Secondary | ICD-10-CM

## 2014-12-19 DIAGNOSIS — E039 Hypothyroidism, unspecified: Secondary | ICD-10-CM | POA: Insufficient documentation

## 2014-12-19 LAB — HEMOGLOBIN A1C: Hgb A1c MFr Bld: 5.7 % (ref 4.6–6.5)

## 2014-12-19 MED ORDER — SERTRALINE HCL 50 MG PO TABS: 50.0000 mg | ORAL_TABLET | Freq: Every day | ORAL | Status: DC

## 2014-12-19 NOTE — Progress Notes (Signed)
HPI:  Was scheduled for a physical today but reports has a lot going on today. We opted to reschedule physical and address her concerns today.  GAD/Depression: -husband cheated on her -they are trying to work this out - getting counseling -she is seeing a Social worker -having mild depressed mood, GAD about this,  -was on zoloft in the past - this really helped  Prediabetes: -advised lifestyle recs -reports:she is worried about this an wants to recheck -reports with everything going on gained 10lbs -denies: polyuria, polydipsia, vision changes  Hypothyroidism: -sees Dr. Bubba Camp -TSH checked with Dr. Bubba Camp  GERD: -meds: pepcid -stable -denies: dysphagia, vomiting, nausea  Migraines: -chronic, has seen multiple HA specialists in the past -takes imitrex and zofran when bad and works ok -much less frequently since menopause -denies: change, vision loss, weight loss, fevers  R arm fx: -was in [place] when this happened -had surgical repair a 11/27/14 -seeing Dr. Amedeo Plenty -not taking any pain medications now   Past Medical History  Diagnosis Date  . Migraine   . Thyroid nodule 2011  . Hypothyroidism, iatrogenic     Follow by Dr. Bubba Camp  . GERD (gastroesophageal reflux disease)   . Eczema     Past Surgical History  Procedure Laterality Date  . Right knee  2004    arthroscopic  . Breast surgery  1999    augmentation - saline  . Thyroid surgery  2011  . Colonoscopy    . Open reduction internal fixation (orif) distal radial fracture Right 11/27/2014    Procedure: OPEN REDUCTION INTERNAL FIXATION (ORIF) RIGHT  DISTAL RADIAL FRACTURE WITH ALLOGRAFT BONE GRAFTING AND REPAIR;  Surgeon: Roseanne Kaufman, MD;  Location: [facility];  Service: Orthopedics;  Laterality: Right;    Family History  Problem Relation Age of Onset  . Cancer Mother 29    breast, stomach ca  . Cancer Father     colon  . Cancer Other     thyroid    History   Social History  . Marital Status: Married    Spouse Name: N/A  . Number of Children: 2  . Years of Education: N/A   Occupational History  . [employer]   Social History Main Topics  . Smoking status: Former Research scientist (life sciences)  . Smokeless tobacco: Never Used     Comment: smoked for 10 years, quit in 2002  . Alcohol Use: Yes     Comment: 1 drink every week  . Drug Use: No  . Sexual Activity: Not on file   Other Topics Concern  . 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  Ascorbic Acid (VITAMIN C) 1000 MG tablet, Take 1,000 mg by mouth daily., Disp: , Rfl:  .  aspirin-acetaminophen-caffeine (EXCEDRIN MIGRAINE) 250-250-65 MG per tablet, Take by mouth every 6 (six) hours as needed for headache., Disp: , Rfl:  .  CALCIUM CARBONATE PO, Take 500 mg by mouth., Disp: , Rfl:  .  famotidine (PEPCID) 10 MG tablet, Take 10 mg by mouth daily as needed for heartburn or indigestion., Disp: , Rfl:  .  levothyroxine (SYNTHROID, LEVOTHROID) 75 MCG tablet, Take 75 mcg by mouth daily before breakfast., Disp: , Rfl:  .  MAGNESIUM PO, Take 600 mg by mouth at bedtime., Disp: , Rfl:  .  methocarbamol (ROBAXIN) 500 MG tablet, Take 500 mg by mouth every 6 (six) hours., Disp: , Rfl:  .  ondansetron (ZOFRAN ODT) 4 MG disintegrating tablet, Take 1 tablet (4 mg total) by mouth every 8 (eight) hours as needed for nausea or vomiting., Disp: 20 tablet, Rfl: 0 .  oxyCODONE (OXY IR/ROXICODONE) 5 MG immediate release tablet, Take 5 mg by mouth every 4 (four) hours as needed for moderate pain or severe pain., Disp: , Rfl:  .  SUMAtriptan (IMITREX) 100 MG tablet, Take 1 tablet (100 mg total) by mouth every 2 (two) hours as needed for migraine., Disp: 10 tablet, Rfl: 5 .  triamcinolone ointment (KENALOG) 0.1 %, Apply 1 application topically 2 (two) times  daily., Disp: 30 g, Rfl: 0 .  sertraline (ZOLOFT) 50 MG tablet, Take 1 tablet (50 mg total) by mouth daily., Disp: 90 tablet, Rfl: 3  EXAM:  Filed Vitals:   12/19/14 0935  BP: 108/80  Pulse: 68  Temp: 97.7 F (36.5 C)    GENERAL: vitals reviewed and listed below, alert, oriented, appears well hydrated and in no acute distress  HEENT: head atraumatic, PERRLA, normal appearance of eyes, ears, nose and mouth. moist mucus membranes.  NECK: supple, no masses or lymphadenopathy  LUNGS: clear to auscultation bilaterally, no rales, rhonchi or wheeze  CV: HRRR, no peripheral edema or cyanosis, normal pedal pulses  BREAST: normal appearance - no lesions or discharge, on palpation normal breast tissue without any suspicious masses  ABDOMEN: bowel sounds normal, soft, non tender to palpation, no masses, no rebound or guarding  GU: normal appearance of external genitalia - no lesions or masses, normal vaginal mucosa - no abnormal discharge, normal appearance of cervix - no lesions or abnormal discharge, no masses or tenderness on palpation of uterus and ovaries.  RECTAL: refused  SKIN: no rash or abnormal lesions  MS: normal gait, moves all extremities normally  NEURO: CN II-XII grossly intact, normal muscle strength and sensation to light touch on extremities  PSYCH: normal affect, pleasant and cooperative  ASSESSMENT AND PLAN:  Discussed the following assessment and plan:  Anxiety and depression - Plan: sertraline (ZOLOFT) 50 MG tablet -supported and counseled -opted to restart zoloft -she has counselor and plans to restart counseling -follow up in 1 month or sooner if needed  Prediabetes Hyperglycemia - Plan: Hemoglobin A1c -lifestyle recs  Cluster headache, not intractable, unspecified chronicity pattern  Migraine without aura and without status migrainosus, not intractable  Gastroesophageal reflux disease, esophagitis presence not specified  Hypothyroidism,  unspecified hypothyroidism type -sees Dr. Suzette Battiest, she may transfer her routine thyroid checks to use if ok with Dr. Suzette Battiest     Orders Placed This Encounter  Procedures  . Hemoglobin A1c    Patient advised to return to clinic immediately if symptoms worsen or persist or new concerns.  Patient Instructions  BEFORE YOU LEAVE: -labs -schedule CPE with pap in 1 month  Start the zoloft 50 mg daily  Start back up with counseling  We recommend the following healthy lifestyle measures: - eat a healthy diet consisting of lots of vegetables, fruits, beans, nuts, seeds, healthy meats such as white chicken and fish - avoid fried foods, fast food, processed foods, sodas, red meet and other fattening foods.  - get a least 150 minutes of aerobic exercise per week.             No Follow-up  on file.  Colin Benton R.

## 2014-12-19 NOTE — Progress Notes (Signed)
Pre visit review using our clinic review tool, if applicable. No additional management support is needed unless otherwise documented below in the visit note. 

## 2014-12-19 NOTE — Patient Instructions (Signed)
BEFORE YOU LEAVE: -labs -schedule CPE with pap in 1 month  Start the zoloft 50 mg daily  Start back up with counseling  We recommend the following healthy lifestyle measures: - eat a healthy diet consisting of lots of vegetables, fruits, beans, nuts, seeds, healthy meats such as white chicken and fish - avoid fried foods, fast food, processed foods, sodas, red meet and other fattening foods.  - get a least 150 minutes of aerobic exercise per week.

## 2015-08-15 LAB — HM MAMMOGRAPHY

## 2015-08-16 ENCOUNTER — Encounter: Payer: Self-pay | Admitting: Family Medicine

## 2015-09-17 MED FILL — LEVOTHYROXINE 75 MCG TABLET: 75 | 90 days supply | Qty: 90 | Fill #4

## 2015-10-16 DIAGNOSIS — Z6826 Body mass index (BMI) 26.0-26.9, adult: Secondary | ICD-10-CM | POA: Diagnosis not present

## 2015-10-16 DIAGNOSIS — Z1212 Encounter for screening for malignant neoplasm of rectum: Secondary | ICD-10-CM | POA: Diagnosis not present

## 2015-10-16 DIAGNOSIS — Z113 Encounter for screening for infections with a predominantly sexual mode of transmission: Secondary | ICD-10-CM | POA: Diagnosis not present

## 2015-10-16 DIAGNOSIS — Z01419 Encounter for gynecological examination (general) (routine) without abnormal findings: Secondary | ICD-10-CM | POA: Diagnosis not present

## 2015-10-16 MED FILL — ALPRAZolam 0.5 MG TABS: 0.5 | 15 days supply | Qty: 60 | Fill #0

## 2015-12-11 DIAGNOSIS — D3121 Benign neoplasm of right retina: Secondary | ICD-10-CM | POA: Diagnosis not present

## 2015-12-11 DIAGNOSIS — H47212 Primary optic atrophy, left eye: Secondary | ICD-10-CM | POA: Diagnosis not present

## 2015-12-20 MED FILL — LEVOTHYROXINE 75 MCG TABLET: 75 | 90 days supply | Qty: 90 | Fill #0

## 2016-01-13 ENCOUNTER — Encounter: Payer: Self-pay | Admitting: Gastroenterology

## 2016-02-18 MED FILL — ALPRAZolam 0.5 MG TABS: 0.5 | 15 days supply | Qty: 60 | Fill #1

## 2016-02-19 DIAGNOSIS — F329 Major depressive disorder, single episode, unspecified: Secondary | ICD-10-CM | POA: Diagnosis not present

## 2016-02-19 DIAGNOSIS — R7301 Impaired fasting glucose: Secondary | ICD-10-CM | POA: Diagnosis not present

## 2016-02-19 DIAGNOSIS — E890 Postprocedural hypothyroidism: Secondary | ICD-10-CM | POA: Diagnosis not present

## 2016-02-24 ENCOUNTER — Encounter: Payer: Self-pay | Admitting: Gastroenterology

## 2016-03-12 ENCOUNTER — Telehealth: Payer: Self-pay

## 2016-03-12 NOTE — Telephone Encounter (Signed)
Dr Silverio Decamp,      Last colon report: "fair, adequate exam" with Moviprep.  OK with Suprep only or would you like a 2-day prep?                                                                                                                                                                    Thank you,                                                                                                                                                                          Salli Bodin//PV

## 2016-03-16 NOTE — Telephone Encounter (Signed)
noted 

## 2016-03-16 NOTE — Telephone Encounter (Signed)
Yes please do extended bowel prep 2 days, and no fiber for 7-10 days before procedure. Thanks

## 2016-03-17 ENCOUNTER — Ambulatory Visit (AMBULATORY_SURGERY_CENTER): Payer: Self-pay

## 2016-03-17 VITALS — Ht 71.0 in | Wt 179.0 lb

## 2016-03-17 DIAGNOSIS — Z800 Family history of malignant neoplasm of digestive organs: Secondary | ICD-10-CM

## 2016-03-17 MED ORDER — NA SULFATE-K SULFATE-MG SULF 17.5-3.13-1.6 GM/177ML PO SOLN: ORAL | Status: DC

## 2016-03-17 MED FILL — SUPREP BOWEL PREP KIT: 17.5-3.13-1 | 2 days supply | Qty: 354 | Fill #0

## 2016-03-17 NOTE — Progress Notes (Signed)
Per pt, no allergies to soy or egg products.Pt not taking any weight loss meds or using  O2 at home. 

## 2016-03-23 MED FILL — LEVOTHYROXINE 75 MCG TABLET: 75 | 90 days supply | Qty: 90 | Fill #1

## 2016-03-23 MED FILL — SERTRALINE HCL 50 MG TABLET: 50 | 90 days supply | Qty: 90 | Fill #0

## 2016-03-31 ENCOUNTER — Ambulatory Visit (AMBULATORY_SURGERY_CENTER): Payer: 59 | Admitting: Gastroenterology

## 2016-03-31 ENCOUNTER — Encounter: Payer: Self-pay | Admitting: Gastroenterology

## 2016-03-31 VITALS — BP 124/76 | HR 60 | Temp 98.6°F | Resp 18 | Ht 71.0 in | Wt 179.0 lb

## 2016-03-31 DIAGNOSIS — D125 Benign neoplasm of sigmoid colon: Secondary | ICD-10-CM | POA: Diagnosis not present

## 2016-03-31 DIAGNOSIS — Z800 Family history of malignant neoplasm of digestive organs: Secondary | ICD-10-CM | POA: Diagnosis not present

## 2016-03-31 DIAGNOSIS — K219 Gastro-esophageal reflux disease without esophagitis: Secondary | ICD-10-CM | POA: Diagnosis not present

## 2016-03-31 DIAGNOSIS — Z1211 Encounter for screening for malignant neoplasm of colon: Secondary | ICD-10-CM

## 2016-03-31 DIAGNOSIS — E039 Hypothyroidism, unspecified: Secondary | ICD-10-CM | POA: Diagnosis not present

## 2016-03-31 HISTORY — PX: COLONOSCOPY: SHX174

## 2016-03-31 NOTE — Op Note (Signed)
[facility] Patient Name: Elizabeth Obrien Procedure Date: 03/31/2016 1:28 PM MRN: [id number] Endoscopist: Mauri Pole , MD Age: 55 Referring MD:  Date of Birth: March 04, 1961 Gender: Female Account #: [id number] Procedure:                Colonoscopy Indications:              Screening patient at increased risk: Family history                            of 1st-degree relative with colorectal cancer at                            age 34 years (or older), Last colonoscopy: 2009 Medicines:                Monitored Anesthesia Care Procedure:                Pre-Anesthesia Assessment:                           - Prior to the procedure, a History and Physical                            was performed, and patient medications and                            allergies were reviewed. The patient's tolerance of                            previous anesthesia was also reviewed. The risks                            and benefits of the procedure and the sedation                            options and risks were discussed with the patient.                            All questions were answered, and informed consent                            was obtained. Prior Anticoagulants: The patient has                            taken no previous anticoagulant or antiplatelet                            agents. ASA Grade Assessment: II - A patient with                            mild systemic disease. After reviewing the risks                            and benefits, the patient was deemed in  satisfactory condition to undergo the procedure.                           After obtaining informed consent, the colonoscope                            was passed under direct vision. Throughout the                            procedure, the patient's blood pressure, pulse, and                            oxygen saturations were monitored continuously. The   Model CF-HQ190L [id number]) scope was introduced                            through the anus and advanced to the the terminal                            ileum, with identification of the appendiceal                            orifice and IC valve. The colonoscopy was performed                            without difficulty. The patient tolerated the                            procedure well. The quality of the bowel                            preparation was good. The terminal ileum, ileocecal                            valve, appendiceal orifice, and rectum were                            photographed. Scope In: 1:41:35 PM Scope Out: 1:55:18 PM Scope Withdrawal Time: 0 hours 10 minutes 10 seconds  Total Procedure Duration: 0 hours 13 minutes 43 seconds  Findings:                 The perianal and digital rectal examinations were                            normal.                           A 5 mm polyp was found in the sigmoid colon. The                            polyp was sessile. The polyp was removed with a                            cold snare. Resection and retrieval were complete.  A few small-mouthed diverticula were found in the                            sigmoid colon, descending colon and ascending colon.                           Non-bleeding internal hemorrhoids were found during                            retroflexion. The hemorrhoids were small. Complications:            No immediate complications. Estimated Blood Loss:     Estimated blood loss was minimal. Impression:               - One 5 mm polyp in the sigmoid colon, removed with                            a cold snare. Resected and retrieved.                           - Diverticulosis in the sigmoid colon, in the                            descending colon and in the ascending colon.                           - Non-bleeding internal hemorrhoids. Recommendation:           - Patient has a contact  number available for                            emergencies. The signs and symptoms of potential                            delayed complications were discussed with the                            patient. Return to normal activities tomorrow.                            Written discharge instructions were provided to the                            patient.                           - Patient has a contact number available for                            emergencies. The signs and symptoms of potential                            delayed complications were discussed with the  patient. Return to normal activities tomorrow.                            Written discharge instructions were provided to the                            patient.                           - Resume previous diet.                           - Continue present medications.                           - Await pathology results.                           - Repeat colonoscopy in 5 years for surveillance.                           - Return to GI clinic PRN. Mauri Pole, MD 03/31/2016 2:02:15 PM This report has been signed electronically.

## 2016-03-31 NOTE — Patient Instructions (Signed)
YOU HAD AN ENDOSCOPIC PROCEDURE TODAY AT [facility]:   Refer to the procedure report that was given to you for any specific questions about what was found during the examination.  If the procedure report does not answer your questions, please call your gastroenterologist to clarify.  If you requested that your care partner not be given the details of your procedure findings, then the procedure report has been included in a sealed envelope for you to review at your convenience later.  YOU SHOULD EXPECT: Some feelings of bloating in the abdomen. Passage of more gas than usual.  Walking can help get rid of the air that was put into your GI tract during the procedure and reduce the bloating. If you had a lower endoscopy (such as a colonoscopy or flexible sigmoidoscopy) you may notice spotting of blood in your stool or on the toilet paper. If you underwent a bowel prep for your procedure, you may not have a normal bowel movement for a few days.  Please Note:  You might notice some irritation and congestion in your nose or some drainage.  This is from the oxygen used during your procedure.  There is no need for concern and it should clear up in a day or so.  SYMPTOMS TO REPORT IMMEDIATELY:   Following lower endoscopy (colonoscopy or flexible sigmoidoscopy):  Excessive amounts of blood in the stool  Significant tenderness or worsening of abdominal pains  Swelling of the abdomen that is new, acute  Fever of 100F or higher   For urgent or emergent issues, a gastroenterologist can be reached at any hour by calling [phone].   DIET: Your first meal following the procedure should be a small meal and then it is ok to progress to your normal diet. Heavy or fried foods are harder to digest and may make you feel nauseous or bloated.  Likewise, meals heavy in dairy and vegetables can increase bloating.  Drink plenty of fluids but you should avoid alcoholic beverages for 24  hours.  ACTIVITY:  You should plan to take it easy for the rest of today and you should NOT DRIVE or use heavy machinery until tomorrow (because of the sedation medicines used during the test).    FOLLOW UP: Our staff will call the number listed on your records the next business day following your procedure to check on you and address any questions or concerns that you may have regarding the information given to you following your procedure. If we do not reach you, we will leave a message.  However, if you are feeling well and you are not experiencing any problems, there is no need to return our call.  We will assume that you have returned to your regular daily activities without incident.  If any biopsies were taken you will be contacted by phone or by letter within the next 1-3 weeks.  Please call us at [phone] if you have not heard about the biopsies in 3 weeks.    SIGNATURES/CONFIDENTIALITY: You and/or your care partner have signed paperwork which will be entered into your electronic medical record.  These signatures attest to the fact that that the information above on your After Visit Summary has been reviewed and is understood.  Full responsibility of the confidentiality of this discharge information lies with you and/or your care-partner.  Polyp, diverticulosis, hemorrhoids-handouts given  Repeat colonoscopy in 5 years 2022.

## 2016-03-31 NOTE — Progress Notes (Signed)
Called to room to assist during endoscopic procedure.  Patient ID and intended procedure confirmed with present staff. Received instructions for my participation in the procedure from the performing physician.  

## 2016-03-31 NOTE — Progress Notes (Signed)
Report to PACU, RN, vss, BBS= Clear.  

## 2016-04-01 ENCOUNTER — Telehealth: Payer: Self-pay

## 2016-04-01 NOTE — Telephone Encounter (Signed)
No tel number was in the follow up call section.  I called pt's mobil number at [phone] and left a message for the pt to call us back if any questions or concerns. maw

## 2016-04-03 ENCOUNTER — Encounter: Payer: Self-pay | Admitting: Gastroenterology

## 2016-06-23 MED FILL — LEVOTHYROXINE 75 MCG TABLET: 75 | 90 days supply | Qty: 90 | Fill #2

## 2016-08-24 MED FILL — SERTRALINE HCL 50 MG TABLET: 50 | 90 days supply | Qty: 90 | Fill #1

## 2016-09-18 DIAGNOSIS — Z1231 Encounter for screening mammogram for malignant neoplasm of breast: Secondary | ICD-10-CM | POA: Diagnosis not present

## 2016-09-18 DIAGNOSIS — Z803 Family history of malignant neoplasm of breast: Secondary | ICD-10-CM | POA: Diagnosis not present

## 2016-09-21 MED FILL — LEVOTHYROXINE 75 MCG TABLET: 75 | 90 days supply | Qty: 90 | Fill #3

## 2016-10-05 DIAGNOSIS — Z45819 Encounter for adjustment or removal of unspecified breast implant: Secondary | ICD-10-CM | POA: Diagnosis not present

## 2016-10-28 ENCOUNTER — Other Ambulatory Visit: Payer: Self-pay | Admitting: Family Medicine

## 2016-10-28 DIAGNOSIS — G43909 Migraine, unspecified, not intractable, without status migrainosus: Secondary | ICD-10-CM

## 2016-10-29 MED FILL — SUMATRIPTAN SUCC 100 MG TAB: 100 | 30 days supply | Qty: 9 | Fill #0

## 2016-11-26 MED FILL — SERTRALINE HCL 50 MG TABLET: 50 | 90 days supply | Qty: 90 | Fill #2

## 2016-12-24 MED FILL — LEVOTHYROXINE 75 MCG TABLET: 75 | 90 days supply | Qty: 90 | Fill #0

## 2016-12-30 DIAGNOSIS — D3121 Benign neoplasm of right retina: Secondary | ICD-10-CM | POA: Diagnosis not present

## 2017-01-08 ENCOUNTER — Encounter: Payer: 59 | Admitting: Family Medicine

## 2017-01-14 NOTE — Progress Notes (Signed)
HPI:  Here for CPE:  -Concerns and/or follow up today:  Here for her physical today. She has not been in in several years.She does have several things she would like to address today and knows that this could result in an additional charge beyond the physical. She suffers from migraines. Has seen several specialists in the past. Usually will take Imitrex or Excedrin for this. With her seasonal allergies this month (nasal congestion, sneezing, itchy eyes), headaches of been a little worse. She has been having a characteristic migraine about once per week. Sometimes this will last for several days. She woke up with a migraine today. Migraines are right sided with an associated mid droop on the right, nausea, vomiting and diarrhea. This is classic for her. She is going to the beach today and would like to get an injection for her migraine today. She also would like for [place] to take over her thyroid medication management. She has been seeing an endocrinologist. She reports there is no history of cancer. She had a partial thyroidectomy, but reports this was benign.She reports she has been stable on regular thyroid medication for several years. No unexplained weight changes, hot or cold intolerance, skin changes or other concerns at this time.  GAD/Depression: -mood is stable, very mild symptoms but doing well overall -PHQ9 1 -continue zoloft and wishes to keep this going -no SI or thoughts of self harm  Prediabetes: -advised lifestyle recs  Hypothyroidism: -sees Dr. Bubba Camp  GERD: -meds: pepcid -stable -denies: dysphagia, vomiting, nausea  Migraines: -chronic, has seen multiple HA specialists in the past -takes imitrex and zofran when bad and works ok -much less frequently since menopause -denies: change, vision loss, weight loss, fevers   -Diet: variety of foods, balance and well rounded  -Exercise:  regular exercise  -Taking folic acid, vitamin D or calcium: no  -pap history:  reports this was done in 2017 with her gynecologist, Dr. Tressia Danas - reports has annual exam this year as well  -FDLMP:n/a  -wants STI testing (Hep C if born [birth year]): agrees to hep c and hiv screening  -FH breast, colon or ovarian ca: see FH Last mammogram:reports utd and sees gyn for beast exams Last colon cancer screening: 7/17  -Alcohol, Tobacco, drug use: see social history  Review of Systems - no fevers, unintentional weight loss, vision loss, hearing loss, chest pain, sob, hemoptysis, melena, hematochezia, hematuria, genital discharge, changing or concerning skin lesions, bleeding, bruising, loc, thoughts of self harm or SI  Past Medical History:  Diagnosis Date  . Anxiety and depression   . Elevated blood sugar    Hx of prediabetes  . GERD (gastroesophageal reflux disease)   . Hand eczema   . Hypothyroidism, iatrogenic    Follow by Dr. Bubba Camp, s/p partial thyroidectomy for nodule  . Migraine    hx cluster type    Past Surgical History:  Procedure Laterality Date  . BREAST SURGERY  1999   augmentation - saline  . COLONOSCOPY    . OPEN REDUCTION INTERNAL FIXATION (ORIF) DISTAL RADIAL FRACTURE Right 11/27/2014   Procedure: OPEN REDUCTION INTERNAL FIXATION (ORIF) RIGHT  DISTAL RADIAL FRACTURE WITH ALLOGRAFT BONE GRAFTING AND REPAIR;  Surgeon: Roseanne Kaufman, MD;  Location: [facility];  Service: Orthopedics;  Laterality: Right;  . right knee  2004   arthroscopic  . THYROID SURGERY  2011    Family History  Problem Relation Age of Onset  . Cancer Mother 28       breast, stomach ca  .  Cancer Father        colon  . Colon cancer Father   . Colon cancer Paternal Grandmother   . Cancer Other        thyroid    Social History   Social History  . Marital status: Married    Spouse name: N/A  . Number of children: 2  . Years of education: N/A   Occupational History  . [employer]   Social History Main Topics  . Smoking status: Former Smoker    Quit date:  10/27/2000  . Smokeless tobacco: Never Used     Comment: smoked for 10 years, quit in 2002  . Alcohol use 0.6 oz/week    1 Cans of beer per week     Comment: 1 drink every week  . Drug use: No  . Sexual activity: Not Asked   Other Topics Concern  . 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  Ascorbic Acid (VITAMIN C) 1000 MG tablet, Take 1,000 mg by mouth daily. Reported on 03/17/2016, Disp: , Rfl:  .  aspirin-acetaminophen-caffeine (EXCEDRIN MIGRAINE) 250-250-65 MG per tablet, Take by mouth every 6 (six) hours as needed for headache., Disp: , Rfl:  .  CALCIUM CARBONATE PO, Take 500 mg by mouth. Reported on 03/17/2016, Disp: , Rfl:  .  famotidine (PEPCID) 10 MG tablet, Take 10 mg by mouth daily as needed for heartburn or indigestion., Disp: , Rfl:  .  levothyroxine (SYNTHROID, LEVOTHROID) 75 MCG tablet, Take 75 mcg by mouth daily before breakfast., Disp: , Rfl:  .  MAGNESIUM PO, Take 600 mg by mouth at bedtime., Disp: , Rfl:  .  methocarbamol (ROBAXIN) 500 MG tablet, Take 500 mg by mouth every 6 (six) hours. Reported on 03/17/2016, Disp: , Rfl:  .  sertraline (ZOLOFT) 50 MG tablet, Take 1 tablet (50 mg total) by mouth daily., Disp: 90 tablet, Rfl: 3 .  SUMAtriptan (IMITREX) 100 MG tablet, Take one tablet by mouth at onset migraine. May repeat once in 2 hours., Disp: 10 tablet, Rfl: 3 .  fluticasone (FLONASE) 50 MCG/ACT nasal spray, Place 2 sprays into both nostrils daily., Disp: 16 g, Rfl: 6  EXAM:  Vitals:   01/15/17 1022  BP: 102/80  Pulse: (!) 52  Temp: 97.6 F (36.4 C)    GENERAL: vitals reviewed and listed below, alert, oriented, appears well hydrated and in no acute distress  HEENT: head atraumatic, PERRLA, normal appearance of eyes, ears, nose and mouth. moist  mucus membranes., normal appearance of ear canals and TMs, clear nasal congestion with sig swelling/boggy pale turbinate,  mild post oropharyngeal erythema with PND, no tonsillar edema or exudate, no sinus TTP  NECK: supple, no masses or lymphadenopathy  LUNGS: clear to auscultation bilaterally, no rales, rhonchi or wheeze  CV: HRRR, no peripheral edema or cyanosis, normal pedal pulses  ABDOMEN: bowel sounds normal, soft, non tender to palpation, no masses, no rebound or guarding  SKIN: no rash or abnormal lesions - scabbed lesion upper L back   GU/BREAST: declined - does with gyn  MS: normal gait, moves all extremities normally  NEURO: normal gait, speech and thought processing grossly intact, muscle tone  grossly intact throughout, CN II-XII grossly intact, finger to nose normal, no appreciable droop today  PSYCH: normal affect, pleasant and cooperative  ASSESSMENT AND PLAN:  Discussed the following assessment and plan: Several issues addressed today above and beyond normal CPE with trial new IM treatment for migraine, new dx allergies and treatment plan,  and assuming care of chronic issue she used to see specialist for along with follow up chronic conditions.  Encounter for preventive health examination - Plan: Lipid panel, Hemoglobin A1c, Hepatitis C antibody, HIV antibody -Discussed and advised all [place] preventive services health task force level A and B recommendations for age, sex and risks. -Advised at least 150 minutes of exercise per week and a healthy diet with avoidance of (less then 1 serving per week) processed foods, white starches, red meat, fast foods and sweets and consisting of: * 5-9 servings of fresh fruits and vegetables (not corn or potatoes) *nuts and seeds, beans *olives and olive oil *lean meats such as fish and white chicken  *whole grains -assistant asked to obtain gyn women's health/pap info from gyn -fasting labs, studies and vaccines per orders this  encounter  Migraine without aura and without status migrainosus, not intractable -she opted for IM toradol 30 and phenergan 12.5 after discussion for abortive therapy -refills chronic meds -trial antihistamine and Flonase for allergies as may be trigger for her migraines -follow up 3 months or sooner if worsening or new concerns  Anxiety and depression -continue zoloft, stable  Hypothyroidism, unspecified type - Plan: TSH -will take over lab checks and thyroid medication -check TSH today  Advised 1 month follow up if skin lesion on back not resolved.  Orders Placed This Encounter  Procedures  . Lipid panel  . Hemoglobin A1c  . TSH  . Hepatitis C antibody  . HIV antibody    Patient advised to return to clinic immediately if symptoms worsen or persist or new concerns.  Patient Instructions  BEFORE YOU LEAVE: -phenergan 12.5mg , Toradol 30mg  -labs -CPE/pap records from gyn -follow up: 3-4 months - tetanus booster then  Antihistamine (such as allegra or claritin) once daily and flonase 2 sprays each nostril daily for 1 month then 1 spray each nostril daily for the allergies.  We have ordered labs or studies at this visit. It can take up to 1-2 weeks for results and processing. IF results require follow up or explanation, we will call you with instructions. Clinically stable results will be released to your [facility]. If you have not heard from [place] or cannot find your results in [facility] in 2 weeks please contact our office at [phone].  If you are not yet signed up for [facility], please consider signing up.   We recommend the following healthy lifestyle for LIFE: 1) Small portions.   Tip: eat off of a salad plate instead of a dinner plate.   Tip: if you need more or a snack choose fruits, veggies and/or a handful of nuts or seeds.  2) Eat a healthy clean diet.  * Tip: Avoid (less then 1 serving per week): processed foods, sweets, sweetened drinks, white starches (rice, flour,  bread, potatoes, pasta, etc), red meat, fast foods, butter  *Tip: CHOOSE instead   * 5-9 servings per day of fresh or frozen fruits and vegetables (but not corn, potatoes, bananas, canned or dried fruit)   *nuts and seeds, beans   *olives and olive oil   *small portions of lean meats such as fish and white chicken    *  small portions of whole grains  3)Get at least 150 minutes of sweaty aerobic exercise per week.  4)Reduce stress - consider counseling, meditation and relaxation to balance other aspects of your life.   WE NOW OFFER   [facility] FAST TRACK!!!  SAME DAY Appointments for ACUTE CARE  Such as: Sprains, Injuries, cuts, abrasions, rashes, muscle pain, joint pain, back pain Colds, flu, sore throats, headache, allergies, cough, fever  Ear pain, sinus and eye infections Abdominal pain, nausea, vomiting, diarrhea, upset stomach Animal/insect bites  3 Easy Ways to Schedule: Walk-In Scheduling Call in scheduling Mychart Sign-up: [email or web address]              No Follow-up on file.  Colin Benton R., DO

## 2017-01-15 ENCOUNTER — Encounter: Payer: Self-pay | Admitting: Family Medicine

## 2017-01-15 ENCOUNTER — Ambulatory Visit (INDEPENDENT_AMBULATORY_CARE_PROVIDER_SITE_OTHER): Payer: 59 | Admitting: Family Medicine

## 2017-01-15 VITALS — BP 102/80 | HR 52 | Temp 97.6°F | Ht 70.0 in | Wt 181.6 lb

## 2017-01-15 DIAGNOSIS — F419 Anxiety disorder, unspecified: Secondary | ICD-10-CM

## 2017-01-15 DIAGNOSIS — F329 Major depressive disorder, single episode, unspecified: Secondary | ICD-10-CM

## 2017-01-15 DIAGNOSIS — Z0000 Encounter for general adult medical examination without abnormal findings: Secondary | ICD-10-CM

## 2017-01-15 DIAGNOSIS — G43009 Migraine without aura, not intractable, without status migrainosus: Secondary | ICD-10-CM | POA: Diagnosis not present

## 2017-01-15 DIAGNOSIS — F32A Depression, unspecified: Secondary | ICD-10-CM

## 2017-01-15 DIAGNOSIS — E039 Hypothyroidism, unspecified: Secondary | ICD-10-CM | POA: Diagnosis not present

## 2017-01-15 DIAGNOSIS — G43909 Migraine, unspecified, not intractable, without status migrainosus: Secondary | ICD-10-CM

## 2017-01-15 LAB — TSH: TSH: 0.78 u[IU]/mL (ref 0.35–4.50)

## 2017-01-15 LAB — HEMOGLOBIN A1C: Hgb A1c MFr Bld: 5.9 % (ref 4.6–6.5)

## 2017-01-15 LAB — LIPID PANEL
CHOLESTEROL: 191 mg/dL (ref 0–200)
HDL: 62.7 mg/dL (ref >39.00)
LDL Cholesterol: 116 mg/dL — ABNORMAL HIGH (ref 0–99)
NonHDL: 127.84
Total CHOL/HDL Ratio: 3
Triglycerides: 58 mg/dL (ref 0.0–149.0)
VLDL: 11.6 mg/dL (ref 0.0–40.0)

## 2017-01-15 MED ORDER — SERTRALINE HCL 50 MG PO TABS: 50.0000 mg | ORAL_TABLET | Freq: Every day | ORAL | 3 refills | Status: DC

## 2017-01-15 MED ORDER — SUMATRIPTAN SUCCINATE 100 MG PO TABS: ORAL_TABLET | ORAL | 3 refills | Status: DC

## 2017-01-15 MED ORDER — KETOROLAC TROMETHAMINE 30 MG/ML IJ SOLN
30.0000 mg | Freq: Once | INTRAMUSCULAR | Status: AC
  Administered 2017-01-15: 30 mg via INTRAMUSCULAR

## 2017-01-15 MED ORDER — PROMETHAZINE HCL 25 MG/ML IJ SOLN
12.5000 mg | Freq: Once | INTRAMUSCULAR | Status: AC
  Administered 2017-01-15: 12.5 mg via INTRAMUSCULAR

## 2017-01-15 MED ORDER — FLUTICASONE PROPIONATE 50 MCG/ACT NA SUSP: 2.0000 | Freq: Every day | NASAL | 6 refills | Status: DC

## 2017-01-15 NOTE — Patient Instructions (Addendum)
BEFORE YOU LEAVE: -phenergan 12.5mg , Toradol 30mg  -labs -CPE/pap records from gyn -follow up: 3-4 months - tetanus booster then  Antihistamine (such as allegra or claritin) once daily and flonase 2 sprays each nostril daily for 1 month then 1 spray each nostril daily for the allergies.  We have ordered labs or studies at this visit. It can take up to 1-2 weeks for results and processing. IF results require follow up or explanation, we will call you with instructions. Clinically stable results will be released to your [facility]. If you have not heard from [place] or cannot find your results in [facility] in 2 weeks please contact our office at [phone].  If you are not yet signed up for [facility], please consider signing up.   We recommend the following healthy lifestyle for LIFE: 1) Small portions.   Tip: eat off of a salad plate instead of a dinner plate.   Tip: if you need more or a snack choose fruits, veggies and/or a handful of nuts or seeds.  2) Eat a healthy clean diet.  * Tip: Avoid (less then 1 serving per week): processed foods, sweets, sweetened drinks, white starches (rice, flour, bread, potatoes, pasta, etc), red meat, fast foods, butter  *Tip: CHOOSE instead   * 5-9 servings per day of fresh or frozen fruits and vegetables (but not corn, potatoes, bananas, canned or dried fruit)   *nuts and seeds, beans   *olives and olive oil   *small portions of lean meats such as fish and white chicken    *small portions of whole grains  3)Get at least 150 minutes of sweaty aerobic exercise per week.  4)Reduce stress - consider counseling, meditation and relaxation to balance other aspects of your life.   WE NOW OFFER   [facility] FAST TRACK!!!  SAME DAY Appointments for ACUTE CARE  Such as: Sprains, Injuries, cuts, abrasions, rashes, muscle pain, joint pain, back pain Colds, flu, sore throats, headache, allergies, cough, fever  Ear pain, sinus and eye infections Abdominal  pain, nausea, vomiting, diarrhea, upset stomach Animal/insect bites  3 Easy Ways to Schedule: Walk-In Scheduling Call in scheduling Mychart Sign-up: [email or web address]

## 2017-01-15 NOTE — Progress Notes (Signed)
Patient was given an injection of Ketorolac and Promethazine at 11:25am and asked to return to room 7 and wait for 15 minutes.  Patient did not return to the room to wait in order for me to check for any adverse reactions as she stated this was the first time she has received this type of injection.

## 2017-01-15 NOTE — Addendum Note (Signed)
Addended by: Agnes Lawrence on: 01/15/2017 11:42 AM   Modules accepted: Orders

## 2017-01-16 LAB — HIV ANTIBODY (ROUTINE TESTING W REFLEX): HIV 1&2 Ab, 4th Generation: NONREACTIVE

## 2017-01-16 LAB — HEPATITIS C ANTIBODY: HCV AB: NEGATIVE

## 2017-02-11 ENCOUNTER — Other Ambulatory Visit: Payer: Self-pay | Admitting: Family Medicine

## 2017-02-11 DIAGNOSIS — G43909 Migraine, unspecified, not intractable, without status migrainosus: Secondary | ICD-10-CM

## 2017-02-11 MED FILL — SUMATRIPTAN SUCC 100 MG TAB: 100 | 30 days supply | Qty: 10 | Fill #0

## 2017-02-18 DIAGNOSIS — E890 Postprocedural hypothyroidism: Secondary | ICD-10-CM | POA: Diagnosis not present

## 2017-02-18 DIAGNOSIS — R7301 Impaired fasting glucose: Secondary | ICD-10-CM | POA: Diagnosis not present

## 2017-02-25 MED FILL — SERTRALINE HCL 50 MG TABLET: 50 | 90 days supply | Qty: 90 | Fill #0

## 2017-03-09 DIAGNOSIS — Z01419 Encounter for gynecological examination (general) (routine) without abnormal findings: Secondary | ICD-10-CM | POA: Diagnosis not present

## 2017-03-09 DIAGNOSIS — Z6824 Body mass index (BMI) 24.0-24.9, adult: Secondary | ICD-10-CM | POA: Diagnosis not present

## 2017-03-29 MED FILL — LEVOTHYROXINE 75 MCG TABLET: 75 | 90 days supply | Qty: 90 | Fill #1

## 2017-04-13 MED FILL — SUMATRIPTAN SUCC 100 MG TAB: 100 | 30 days supply | Qty: 10 | Fill #0

## 2017-04-22 ENCOUNTER — Ambulatory Visit: Payer: 59 | Admitting: Family Medicine

## 2017-04-22 DIAGNOSIS — R1310 Dysphagia, unspecified: Secondary | ICD-10-CM | POA: Diagnosis not present

## 2017-04-22 DIAGNOSIS — R634 Abnormal weight loss: Secondary | ICD-10-CM | POA: Diagnosis not present

## 2017-04-22 DIAGNOSIS — R110 Nausea: Secondary | ICD-10-CM | POA: Diagnosis not present

## 2017-04-22 DIAGNOSIS — R1314 Dysphagia, pharyngoesophageal phase: Secondary | ICD-10-CM | POA: Diagnosis not present

## 2017-04-22 DIAGNOSIS — F439 Reaction to severe stress, unspecified: Secondary | ICD-10-CM | POA: Diagnosis not present

## 2017-04-22 DIAGNOSIS — F430 Acute stress reaction: Secondary | ICD-10-CM | POA: Diagnosis not present

## 2017-04-22 MED FILL — ESOMEPRAZOLE MAG DR 40 MG C: 40 | 15 days supply | Qty: 30 | Fill #0

## 2017-04-27 ENCOUNTER — Encounter: Payer: Self-pay | Admitting: Family Medicine

## 2017-04-27 ENCOUNTER — Ambulatory Visit (INDEPENDENT_AMBULATORY_CARE_PROVIDER_SITE_OTHER): Payer: 59 | Admitting: Family Medicine

## 2017-04-27 VITALS — BP 100/80 | HR 80 | Temp 99.0°F | Ht 70.0 in | Wt 171.1 lb

## 2017-04-27 DIAGNOSIS — R112 Nausea with vomiting, unspecified: Secondary | ICD-10-CM

## 2017-04-27 DIAGNOSIS — R1310 Dysphagia, unspecified: Secondary | ICD-10-CM | POA: Diagnosis not present

## 2017-04-27 DIAGNOSIS — F458 Other somatoform disorders: Secondary | ICD-10-CM

## 2017-04-27 DIAGNOSIS — R0989 Other specified symptoms and signs involving the circulatory and respiratory systems: Secondary | ICD-10-CM

## 2017-04-27 NOTE — Patient Instructions (Signed)
Continue the nexium.  No dairy for 1 week.  Chew food well and drink plenty of fluids.  Do not work until no vomiting or diarrhea for 48 hours.  We placed a referral for you as discussed to the gastroenterologist. It usually takes about 1-2 weeks to process and schedule this referral. If you have not heard from [place] regarding this appointment in 2 weeks please contact our office.

## 2017-04-27 NOTE — Progress Notes (Signed)
HPI:   Acute visit for Dysphagia: -started 1 week ago -symptoms initially globus sensation and "couldn't swallow" -seems was more discomfort in throat and upper esophagus then choking on food or food not going down  -she then has nausea, vomiting and diarrhea for 24 hours about 4 days ago -she went to [facility] last week and was started on nexium -no fevers, choking, abdominal pain, wt loss, vomiting the last few days  ROS: See pertinent positives and negatives per HPI.  Past Medical History:  Diagnosis Date  . Anxiety and depression   . Elevated blood sugar    Hx of prediabetes  . GERD (gastroesophageal reflux disease)   . Hand eczema   . Hypothyroidism, iatrogenic    Follow by Dr. Bubba Camp, s/p partial thyroidectomy for nodule  . Migraine    hx cluster type    Past Surgical History:  Procedure Laterality Date  . BREAST SURGERY  1999   augmentation - saline  . COLONOSCOPY    . OPEN REDUCTION INTERNAL FIXATION (ORIF) DISTAL RADIAL FRACTURE Right 11/27/2014   Procedure: OPEN REDUCTION INTERNAL FIXATION (ORIF) RIGHT  DISTAL RADIAL FRACTURE WITH ALLOGRAFT BONE GRAFTING AND REPAIR;  Surgeon: Roseanne Kaufman, MD;  Location: [facility];  Service: Orthopedics;  Laterality: Right;  . right knee  2004   arthroscopic  . THYROID SURGERY  2011    Family History  Problem Relation Age of Onset  . Cancer Mother 71       breast, stomach ca  . Cancer Father        colon  . Colon cancer Father   . Colon cancer Paternal Grandmother   . Cancer Other        thyroid    Social History   Social History  . Marital status: Married    Spouse name: N/A  . Number of children: 2  . Years of education: N/A   Occupational History  . [employer]   Social History Main Topics  . Smoking status: Former Smoker    Quit date: 10/27/2000  . Smokeless tobacco: Never Used     Comment: smoked for 10 years, quit in 2002  . Alcohol use 0.6 oz/week    1 Cans of beer per week     Comment: 1 drink  every week  . Drug use: No  . Sexual activity: Not Asked   Other Topics Concern  . 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  Ascorbic Acid (VITAMIN C) 1000 MG tablet, Take 1,000 mg by mouth daily. Reported on 03/17/2016, Disp: , Rfl:  .  aspirin-acetaminophen-caffeine (EXCEDRIN MIGRAINE) 250-250-65 MG per tablet, Take by mouth every 6 (six) hours as needed for headache., Disp: , Rfl:  .  CALCIUM CARBONATE PO, Take 500 mg by mouth. Reported on 03/17/2016, Disp: , Rfl:  .  famotidine (PEPCID) 10 MG tablet, Take 10 mg by mouth daily as needed for heartburn or indigestion., Disp: , Rfl:  .  fluticasone (FLONASE) 50 MCG/ACT nasal spray, Place 2 sprays into both nostrils daily., Disp: 16 g, Rfl: 6 .  levothyroxine (SYNTHROID, LEVOTHROID) 75 MCG tablet, Take 75 mcg by mouth daily before breakfast., Disp: , Rfl:  .  MAGNESIUM PO, Take 600 mg by mouth at bedtime., Disp: , Rfl:  .  methocarbamol (ROBAXIN) 500 MG tablet, Take 500 mg by mouth every 6 (six) hours. Reported on 03/17/2016, Disp: , Rfl:  .  sertraline (ZOLOFT) 50 MG tablet, Take 1 tablet (50 mg total) by mouth daily., Disp: 90 tablet, Rfl: 3 .  SUMAtriptan (IMITREX) 100 MG tablet, Take one tablet by mouth at onset migraine. May repeat once in 2 hours., Disp: 10 tablet, Rfl: 3 .  SUMAtriptan (IMITREX) 100 MG tablet, TAKE 1 TABLET BY MOUTH EVERY 2 HOURS AS NEEDED FOR MIGRAINE. NEED OFFICE VISIT., Disp: 10 tablet, Rfl: 0  EXAM:  Vitals:   04/27/17 1557  BP: 100/80  Pulse: 80  Temp: 99 F (37.2 C)  SpO2: 98%    Body mass index is 24.55 kg/m.  GENERAL: vitals reviewed and listed above, alert, oriented, appears well hydrated and in no acute distress  HEENT: atraumatic, conjunttiva clear, no obvious abnormalities on  inspection of external nose and ears, normal appearance of ear canals and TMs, clear nasal congestion, mild post oropharyngeal erythema with viral appearing erythematous papules on soft palate and throat with PND, no tonsillar edema or exudate, no sinus TTP  NECK: no obvious masses on inspection  LUNGS: clear to auscultation bilaterally, no wheezes, rales or rhonchi, good air movement  CV: HRRR, no peripheral edema  MS: moves all extremities without noticeable abnormality  PSYCH: pleasant and cooperative, no obvious depression or anxiety  ASSESSMENT AND PLAN:  Discussed the following assessment and plan:  Dysphagia, unspecified type - Plan: Ambulatory referral to Gastroenterology  Globus sensation - Plan: Ambulatory referral to Gastroenterology  Non-intractable vomiting with nausea, unspecified vomiting type  -we discussed possible serious and likely etiologies, workup and treatment, treatment risks and return precautions, viral illness most likely vs other -after this discussion, Georgiann opted for symptomatic care, continue nexium, GI referral - but may cancel GI appointment if feeling better -follow up advised as needed -of course, we advised Olla  to return or notify a doctor immediately if symptoms worsen or persist or new concerns arise.   Patient Instructions  Continue the nexium.  No dairy for 1 week.  Chew food well and drink plenty of fluids.  Do not work until no vomiting or diarrhea for 48 hours.  We placed a referral for you as discussed to the gastroenterologist. It usually takes about 1-2 weeks to process and schedule this referral. If you have not heard from [place] regarding this appointment in 2 weeks please contact our office.    Colin Benton R., DO

## 2017-05-03 ENCOUNTER — Other Ambulatory Visit: Payer: Self-pay | Admitting: Family Medicine

## 2017-05-03 DIAGNOSIS — K219 Gastro-esophageal reflux disease without esophagitis: Secondary | ICD-10-CM

## 2017-05-03 DIAGNOSIS — R110 Nausea: Secondary | ICD-10-CM

## 2017-05-27 ENCOUNTER — Encounter: Payer: Self-pay | Admitting: Family Medicine

## 2017-06-07 MED FILL — SERTRALINE HCL 50 MG TABLET: 50 | 90 days supply | Qty: 90 | Fill #1

## 2017-06-25 MED FILL — LEVOTHYROXINE 75 MCG TABLET: 75 | 90 days supply | Qty: 90 | Fill #2

## 2017-07-05 ENCOUNTER — Ambulatory Visit: Payer: 59 | Admitting: Gastroenterology

## 2017-07-08 DIAGNOSIS — E890 Postprocedural hypothyroidism: Secondary | ICD-10-CM | POA: Diagnosis not present

## 2017-07-08 DIAGNOSIS — R7301 Impaired fasting glucose: Secondary | ICD-10-CM | POA: Diagnosis not present

## 2017-07-08 DIAGNOSIS — F329 Major depressive disorder, single episode, unspecified: Secondary | ICD-10-CM | POA: Diagnosis not present

## 2017-08-19 MED FILL — SUMATRIPTAN SUCC 100 MG TAB: 100 | 30 days supply | Qty: 10 | Fill #1

## 2017-09-04 MED FILL — SERTRALINE HCL 50 MG TABLET: 50 | 90 days supply | Qty: 90 | Fill #2

## 2017-09-16 ENCOUNTER — Encounter: Payer: Self-pay | Admitting: Family Medicine

## 2017-09-27 MED FILL — LEVOTHYROXINE 75 MCG TABLET: 75 | 90 days supply | Qty: 90 | Fill #3

## 2017-12-13 MED FILL — SERTRALINE HCL 50 MG TABLET: 50 | 90 days supply | Qty: 90 | Fill #3

## 2017-12-14 ENCOUNTER — Encounter: Payer: Self-pay | Admitting: Family Medicine

## 2017-12-14 ENCOUNTER — Ambulatory Visit (INDEPENDENT_AMBULATORY_CARE_PROVIDER_SITE_OTHER): Payer: No Typology Code available for payment source | Admitting: Family Medicine

## 2017-12-14 VITALS — BP 118/70 | HR 74 | Temp 98.3°F | Wt 165.0 lb

## 2017-12-14 DIAGNOSIS — H6981 Other specified disorders of Eustachian tube, right ear: Secondary | ICD-10-CM

## 2017-12-14 DIAGNOSIS — R0982 Postnasal drip: Secondary | ICD-10-CM

## 2017-12-14 NOTE — Progress Notes (Signed)
Subjective:    Patient ID: Elizabeth Obrien, female    DOB: 03-Aug-1961, 57 y.o.   MRN: [id number]  Chief Complaint  Patient presents with  . Ear Pain    HPI Patient was seen today for acute concern.  Pt endorses right ear pain times 9 days.  Pt also endorses irritated throat off and on, with cough in am.  Pt endorses being sick with cold-like illness off and on for the last few weeks.  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  Pt has taken DayQuil, NyQuil, Benadryl at night Sudafed with little to no relief.  Patient denies fever, chills, cough during the day.  Past Medical History:  Diagnosis Date  . Anxiety and depression   . Elevated blood sugar    Hx of prediabetes  . GERD (gastroesophageal reflux disease)   . Hand eczema   . Hypothyroidism, iatrogenic    Follow by Dr. Bubba Camp, s/p partial thyroidectomy for nodule  . Migraine    hx cluster type    Allergies  Allergen Reactions  . Other Hives and Rash    Topical antibiotics    ROS General: Denies fever, chills, night sweats, changes in weight, changes in appetite HEENT: Denies headaches, changes in vision, rhinorrhea   + ear pain, sore throat CV: Denies CP, palpitations, SOB, orthopnea Pulm: Denies SOB, wheezing  + cough in a.m. GI: Denies abdominal pain, nausea, vomiting, diarrhea, constipation GU: Denies dysuria, hematuria, frequency, vaginal discharge Msk: Denies muscle cramps, joint pains Neuro: Denies weakness, numbness, tingling Skin: Denies rashes, bruising Psych: Denies depression, anxiety, hallucinations     Objective:    Blood pressure 118/70, pulse 74, temperature 98.3 F (36.8 C), temperature source Oral, weight 165 lb (74.8 kg), SpO2 97 %.   Gen. Pleasant, well-nourished, in no distress, normal affect   HEENT: [place]/AT, face symmetric, no scleral icterus, PERRLA, nares patent without drainage, pharynx with postnasal drainage, no erythema or exudate.  TMs  full bilaterally. Lungs: no accessory muscle use, CTAB, no wheezes or rales Cardiovascular: RRR, no m/r/g, no peripheral edema Neuro:  A&Ox3, CN II-XII intact, normal gait   Wt Readings from Last 3 Encounters:  12/14/17 165 lb (74.8 kg)  04/27/17 171 lb 1.6 oz (77.6 kg)  01/15/17 181 lb 9.6 oz (82.4 kg)    Lab Results  Component Value Date   WBC 6.2 11/27/2014   HGB 14.8 11/27/2014   HCT 43.9 11/27/2014   PLT 246 11/27/2014   GLUCOSE 116 (H) 04/22/2010   CHOL 191 01/15/2017   TRIG 58.0 01/15/2017   HDL 62.70 01/15/2017   LDLCALC 116 (H) 01/15/2017   NA 141 04/22/2010   K 3.9 04/22/2010   CL 107 04/22/2010   CREATININE 0.95 04/22/2010   BUN 14 04/22/2010   CO2 27 04/22/2010   TSH 0.78 01/15/2017   INR 1.00 04/22/2010   HGBA1C 5.9 01/15/2017    Assessment/Plan:  Dysfunction of right eustachian tube -discussed using Flonase and allergy medication. -pt declined rx as she has both at home. -ok to use Tylenol prn for pain/discomfort -pt given RTC precautions including worsening or continued symptoms.  Post-nasal drainage -Discussed using Flonase and OTC allergy pill -pt agrees to trying both -discussed proper nasal spray use  F/u prn  Grier Mitts, MD

## 2017-12-28 MED FILL — LEVOTHYROXINE 75 MCG TABLET: 75 | 90 days supply | Qty: 90 | Fill #0

## 2018-01-12 MED FILL — SUMATRIPTAN SUCC 100 MG TAB: 100 | 30 days supply | Qty: 8 | Fill #2

## 2018-01-24 NOTE — Progress Notes (Signed)
HPI:  Using dictation device. Unfortunately this device frequently misinterprets words/phrases. Due for mammogram (reported doing with gyn in the past, Dr. Tressia Danas) and tetanus booster.  Acute visit for leg pain and follow up.varicosities in L LE with some increased swelling and TP for 1 month. Now seem a little better. No redness, warmth, drainage, SOB, fevers but she is worried about clot. No traveling or surgery prior to symptoms. Doing compression and elevation.  Past due for follow up and due for physical. PMH anxiety, hypothyroidism, migraines and seasonal allergies. See below for summary, updated with changes. Wants [place] to take over management of the zoloft and thyroid medication. Reports stable. Deals with stress from spouse - but feels safe. Interested in counseling. No SI or thoughts of harm.  GAD/Depression: -mood is stable, very mild symptoms but doing well overall -PHQ9 6 today -continue zoloft and wishes to keep this going -interested in counseling -stress from spouse -no SI or thoughts of self harm  Prediabetes: -advised lifestyle recs  Hypothyroidism: -s/p non cancer resection  -used to see Dr. Chalmers Cater, reports stable for many years -on levothyroxine  GERD: -meds: pepcid rarely now - symptoms improved -denies: dysphagia, vomiting, nausea  Migraines: -chronic, has seen multiple HA specialists in the past -takes imitrex and zofran when bad and works ok -much less frequently since menopause -denies: change, vision loss, weight loss, fevers    ROS: See pertinent positives and negatives per HPI.  Past Medical History:  Diagnosis Date  . Anxiety and depression   . Elevated blood sugar    Hx of prediabetes  . GERD (gastroesophageal reflux disease)   . Hand eczema   . Hypothyroidism, iatrogenic    Follow by Dr. Bubba Camp, s/p partial thyroidectomy for nodule  . Migraine    hx cluster type    Past Surgical History:  Procedure Laterality Date  . BREAST  SURGERY  1999   augmentation - saline  . COLONOSCOPY    . OPEN REDUCTION INTERNAL FIXATION (ORIF) DISTAL RADIAL FRACTURE Right 11/27/2014   Procedure: OPEN REDUCTION INTERNAL FIXATION (ORIF) RIGHT  DISTAL RADIAL FRACTURE WITH ALLOGRAFT BONE GRAFTING AND REPAIR;  Surgeon: Roseanne Kaufman, MD;  Location: [facility];  Service: Orthopedics;  Laterality: Right;  . right knee  2004   arthroscopic  . THYROID SURGERY  2011    Family History  Problem Relation Age of Onset  . Cancer Mother 51       breast, stomach ca  . Cancer Father        colon  . Colon cancer Father   . Colon cancer Paternal Grandmother   . Cancer Other        thyroid    SOCIAL HX: see hpi   Current Outpatient Medications:  .  Ascorbic Acid (VITAMIN C) 1000 MG tablet, Take 1,000 mg by mouth daily. Reported on 03/17/2016, Disp: , Rfl:  .  aspirin-acetaminophen-caffeine (EXCEDRIN MIGRAINE) 250-250-65 MG per tablet, Take by mouth every 6 (six) hours as needed for headache., Disp: , Rfl:  .  CALCIUM CARBONATE PO, Take 500 mg by mouth. Reported on 03/17/2016, Disp: , Rfl:  .  famotidine (PEPCID) 10 MG tablet, Take 10 mg by mouth daily as needed for heartburn or indigestion., Disp: , Rfl:  .  fluticasone (FLONASE) 50 MCG/ACT nasal spray, Place 2 sprays into both nostrils daily., Disp: 16 g, Rfl: 6 .  levothyroxine (SYNTHROID, LEVOTHROID) 75 MCG tablet, Take 75 mcg by mouth daily before breakfast., Disp: , Rfl:  .  MAGNESIUM  PO, Take 600 mg by mouth at bedtime., Disp: , Rfl:  .  methocarbamol (ROBAXIN) 500 MG tablet, Take 500 mg by mouth every 6 (six) hours. Reported on 03/17/2016, Disp: , Rfl:  .  sertraline (ZOLOFT) 50 MG tablet, Take 1 tablet (50 mg total) by mouth daily., Disp: 90 tablet, Rfl: 3 .  SUMAtriptan (IMITREX) 100 MG tablet, Take one tablet by mouth at onset migraine. May repeat once in 2 hours., Disp: 10 tablet, Rfl: 3  EXAM:  Vitals:   01/25/18 0745  BP: 98/60  Pulse: 71  Temp: 98.4 F (36.9 C)  SpO2: 98%     Body mass index is 23.04 kg/m.  GENERAL: vitals reviewed and listed above, alert, oriented, appears well hydrated and in no acute distress  HEENT: atraumatic, conjunttiva clear, no obvious abnormalities on inspection of external nose and ears  NECK: no obvious masses on inspection  LUNGS: clear to auscultation bilaterally, no wheezes, rales or rhonchi, good air movement  CV: HRRR, no peripheral edema - a few varicosities in LLE upper lat thigh and calf without redness, swelling, warmth  MS: moves all extremities without noticeable abnormality  PSYCH: pleasant and cooperative, no obvious depression or anxiety  ASSESSMENT AND PLAN:  Discussed the following assessment and plan:  Painful veins - Plan: VAS [place] LOWER EXTREMITY VENOUS (DVT)  Hyperlipidemia, unspecified hyperlipidemia type - Plan: Lipid panel  Hyperglycemia - Plan: Hemoglobin A1c  Hypothyroidism, unspecified type - Plan: TSH  Stress  -[place] L LE, elevation, compression, nsaids if needed -labs per orders -brochure for CBT provided -follow up in 3 months for CPE, sooner as needed   Patient Instructions  BEFORE YOU LEAVE: -labs -follow up: CPE in 3 months  I sent an order for the venous ultrasound of the leg. In the interim can use compression, compresses, elevation and aleve per instructions.  Call to schedule counseling.  We have ordered labs or studies at this visit. It can take up to 1-2 weeks for results and processing. IF results require follow up or explanation, we will call you with instructions. Clinically stable results will be released to your [facility]. If you have not heard from [place] or cannot find your results in [facility] in 2 weeks please contact our office at [phone].  If you are not yet signed up for [facility], please consider signing up.           Lucretia Kern, DO

## 2018-01-25 ENCOUNTER — Ambulatory Visit (INDEPENDENT_AMBULATORY_CARE_PROVIDER_SITE_OTHER): Payer: No Typology Code available for payment source | Admitting: Family Medicine

## 2018-01-25 ENCOUNTER — Encounter: Payer: Self-pay | Admitting: Family Medicine

## 2018-01-25 VITALS — BP 98/60 | HR 71 | Temp 98.4°F | Ht 70.0 in | Wt 160.6 lb

## 2018-01-25 DIAGNOSIS — R739 Hyperglycemia, unspecified: Secondary | ICD-10-CM

## 2018-01-25 DIAGNOSIS — F439 Reaction to severe stress, unspecified: Secondary | ICD-10-CM | POA: Diagnosis not present

## 2018-01-25 DIAGNOSIS — R0989 Other specified symptoms and signs involving the circulatory and respiratory systems: Secondary | ICD-10-CM

## 2018-01-25 DIAGNOSIS — E039 Hypothyroidism, unspecified: Secondary | ICD-10-CM

## 2018-01-25 DIAGNOSIS — E785 Hyperlipidemia, unspecified: Secondary | ICD-10-CM

## 2018-01-25 LAB — LIPID PANEL
Cholesterol: 190 mg/dL (ref 0–200)
HDL: 62.3 mg/dL (ref >39.00)
LDL CALC: 114 mg/dL — AB (ref 0–99)
NonHDL: 127.69
Total CHOL/HDL Ratio: 3
Triglycerides: 66 mg/dL (ref 0.0–149.0)
VLDL: 13.2 mg/dL (ref 0.0–40.0)

## 2018-01-25 LAB — TSH: TSH: 0.58 u[IU]/mL (ref 0.35–4.50)

## 2018-01-25 LAB — HEMOGLOBIN A1C: Hgb A1c MFr Bld: 5.6 % (ref 4.6–6.5)

## 2018-01-25 NOTE — Patient Instructions (Addendum)
BEFORE YOU LEAVE: -labs -follow up: CPE in 3 months  I sent an order for the venous ultrasound of the leg. In the interim can use compression, compresses, elevation and aleve per instructions.  Call to schedule counseling.  We have ordered labs or studies at this visit. It can take up to 1-2 weeks for results and processing. IF results require follow up or explanation, we will call you with instructions. Clinically stable results will be released to your [facility]. If you have not heard from [place] or cannot find your results in [facility] in 2 weeks please contact our office at [phone].  If you are not yet signed up for [facility], please consider signing up.

## 2018-01-28 ENCOUNTER — Ambulatory Visit (HOSPITAL_COMMUNITY)
Admission: RE | Admit: 2018-01-28 | Discharge: 2018-01-28 | Disposition: A | Discharge status: Home / Self Care | Payer: No Typology Code available for payment source | Source: Ambulatory Visit | Attending: Cardiology | Admitting: Cardiology

## 2018-01-28 DIAGNOSIS — R52 Pain, unspecified: Secondary | ICD-10-CM

## 2018-01-28 DIAGNOSIS — I83812 Varicose veins of left lower extremities with pain: Secondary | ICD-10-CM | POA: Insufficient documentation

## 2018-01-28 DIAGNOSIS — R0989 Other specified symptoms and signs involving the circulatory and respiratory systems: Secondary | ICD-10-CM | POA: Diagnosis not present

## 2018-02-10 ENCOUNTER — Encounter: Payer: Self-pay | Admitting: Family Medicine

## 2018-02-10 ENCOUNTER — Ambulatory Visit (INDEPENDENT_AMBULATORY_CARE_PROVIDER_SITE_OTHER): Payer: No Typology Code available for payment source | Admitting: Family Medicine

## 2018-02-10 VITALS — BP 102/80 | HR 78 | Temp 98.3°F | Ht 70.0 in | Wt 157.6 lb

## 2018-02-10 DIAGNOSIS — R112 Nausea with vomiting, unspecified: Secondary | ICD-10-CM | POA: Diagnosis not present

## 2018-02-10 DIAGNOSIS — R197 Diarrhea, unspecified: Secondary | ICD-10-CM

## 2018-02-10 MED ORDER — ONDANSETRON 4 MG PO TBDP: 4.0000 mg | ORAL_TABLET | Freq: Three times a day (TID) | ORAL | 0 refills | Status: DC | PRN

## 2018-02-10 MED FILL — ONDANSETRON HCL 4 MG TABLET: 4 | 7 days supply | Qty: 20 | Fill #0

## 2018-02-10 NOTE — Progress Notes (Signed)
HPI:  Using dictation device. Unfortunately this device frequently misinterprets words/phrases.  Acute visit for NVD: -started acutely 1.5 days ago -watery diarrhea multiple episode, mild sweats, some body aches, ha,several episodes of nonbilious/nonbloody emesis - last episode 9pm last night -has been trying to drink fluids but not as good today -no fevers, hematochezia, melena, severe or focal abd pain  ROS: See pertinent positives and negatives per HPI.  Past Medical History:  Diagnosis Date  . Anxiety and depression   . Elevated blood sugar    Hx of prediabetes  . GERD (gastroesophageal reflux disease)   . Hand eczema   . Hypothyroidism, iatrogenic    Follow by Dr. Bubba Camp, s/p partial thyroidectomy for nodule  . Migraine    hx cluster type    Past Surgical History:  Procedure Laterality Date  . BREAST SURGERY  1999   augmentation - saline  . COLONOSCOPY    . OPEN REDUCTION INTERNAL FIXATION (ORIF) DISTAL RADIAL FRACTURE Right 11/27/2014   Procedure: OPEN REDUCTION INTERNAL FIXATION (ORIF) RIGHT  DISTAL RADIAL FRACTURE WITH ALLOGRAFT BONE GRAFTING AND REPAIR;  Surgeon: Roseanne Kaufman, MD;  Location: [place];  Service: Orthopedics;  Laterality: Right;  . right knee  2004   arthroscopic  . THYROID SURGERY  2011    Family History  Problem Relation Age of Onset  . Cancer Mother 58       breast, stomach ca  . Cancer Father        colon  . Colon cancer Father   . Colon cancer Paternal Grandmother   . Cancer Other        thyroid    SOCIAL HX: see hpi   Current Outpatient Medications:  .  Ascorbic Acid (VITAMIN C) 1000 MG tablet, Take 1,000 mg by mouth daily. Reported on 03/17/2016, Disp: , Rfl:  .  aspirin-acetaminophen-caffeine (EXCEDRIN MIGRAINE) 250-250-65 MG per tablet, Take by mouth every 6 (six) hours as needed for headache., Disp: , Rfl:  .  CALCIUM CARBONATE PO, Take 500 mg by mouth. Reported on 03/17/2016, Disp: , Rfl:  .  famotidine (PEPCID) 10 MG tablet,  Take 10 mg by mouth daily as needed for heartburn or indigestion., Disp: , Rfl:  .  fluticasone (FLONASE) 50 MCG/ACT nasal spray, Place 2 sprays into both nostrils daily., Disp: 16 g, Rfl: 6 .  levothyroxine (SYNTHROID, LEVOTHROID) 75 MCG tablet, Take 75 mcg by mouth daily before breakfast., Disp: , Rfl:  .  MAGNESIUM PO, Take 600 mg by mouth at bedtime., Disp: , Rfl:  .  methocarbamol (ROBAXIN) 500 MG tablet, Take 500 mg by mouth every 6 (six) hours. Reported on 03/17/2016, Disp: , Rfl:  .  sertraline (ZOLOFT) 50 MG tablet, Take 1 tablet (50 mg total) by mouth daily., Disp: 90 tablet, Rfl: 3 .  SUMAtriptan (IMITREX) 100 MG tablet, Take one tablet by mouth at onset migraine. May repeat once in 2 hours., Disp: 10 tablet, Rfl: 3 .  ondansetron (ZOFRAN ODT) 4 MG disintegrating tablet, Take 1 tablet (4 mg total) by mouth every 8 (eight) hours as needed for nausea or vomiting., Disp: 20 tablet, Rfl: 0  EXAM:  Vitals:   02/10/18 1406  BP: 102/80  Pulse: 78  Temp: 98.3 F (36.8 C)  SpO2: 98%    Body mass index is 22.61 kg/m.  GENERAL: vitals reviewed and listed above, alert, oriented, appears well hydrated and in no acute distress  HEENT: atraumatic, conjunttiva clear, no obvious abnormalities on inspection of external nose and  ears  NECK: no obvious masses on inspection  LUNGS: clear to auscultation bilaterally, no wheezes, rales or rhonchi, good air movement  CV: HRRR, no peripheral edema  ABD: BS+ 4 Q, soft, NTTP  MS: moves all extremities without noticeable abnormality  PSYCH: pleasant and cooperative, no obvious depression or anxiety  ASSESSMENT AND PLAN:  Discussed the following assessment and plan:  Nausea vomiting and diarrhea  -discussed potential etiologies, evaluation, management -likely viral gastroenteritis given symptoms resolving -opted for PO rehydration, zofran ODT ---> ER for IVFs, labs, imaging if gets bad again, not continuing to improve, can't tolerate oral  rehydration    There are no Patient Instructions on file for this visit.  Lucretia Kern, DO

## 2018-03-23 ENCOUNTER — Other Ambulatory Visit: Payer: Self-pay | Admitting: Family Medicine

## 2018-03-23 DIAGNOSIS — F419 Anxiety disorder, unspecified: Principal | ICD-10-CM

## 2018-03-23 DIAGNOSIS — F329 Major depressive disorder, single episode, unspecified: Secondary | ICD-10-CM

## 2018-03-24 MED FILL — SERTRALINE HCL 50 MG TABLET: 50 | 90 days supply | Qty: 90 | Fill #0

## 2018-03-28 MED FILL — LEVOTHYROXINE 75 MCG TABLET: 75 | 90 days supply | Qty: 90 | Fill #1

## 2018-05-12 ENCOUNTER — Encounter: Payer: No Typology Code available for payment source | Admitting: Family Medicine

## 2018-05-30 NOTE — Progress Notes (Signed)
HPI:  Using dictation device. Unfortunately this device frequently misinterprets words/phrases.  Elizabeth Obrien is a pleasant 57 y.o. here for follow up on her CPE. Chronic medical problems summarized below were reviewed for changes and stability and were updated as needed below. These issues and their treatment remain stable for the most part.  Has had a rough month.  Separated from her husband recently.  Weight has been poor because of this.  Has good support.  Interested in counseling. Otherwise has been doing well.  Requests refills on her medications, as they were under another provider's name past.  Denies CP, SOB, DOE, treatment intolerance or new symptoms.  Due for tetanus booster, mammo, flu vaccine, labs  GAD/Depression: -mood is stable, very mild symptoms but doing well overall -PHQ9 1 -continues zoloft and wishes to keep this going -no SI or thoughts of self harm  Prediabetes: -advised lifestyle recs  Hypothyroidism: -Not prefers to see [place] for her thyroid  GERD: -meds: pepcid -stable -denies: dysphagia, vomiting, nausea  Migraines: -chronic, has seen multiple HA specialists in the past -takes imitrex and zofran when bad and works ok -much less frequently since menopause -denies: change, vision loss, weight loss, fevers  -Diet: variety of foods, balance and well rounded, larger portion sizes -Exercise: no regular exercise -Taking folic acid, vitamin D or calcium: no -Diabetes and Dyslipidemia Screening: Completed -Vaccines: see vaccine section EPIC -pap history: sees gyn, Dr. Tressia Danas -FDLMP: see nursing notes -sexual activity: yes, female partner, no new partners -wants STI testing (Hep C if born 30-65): no -FH breast, colon or ovarian ca: see FH Last mammogram: sees gyn, [facility], agrees to call to schedule Last colon cancer screening: had colonoscopy 2017 Breast Ca Risk Assessment: see family history and pt history DEXA (>/= 65): Not  applicable  -Alcohol, Tobacco, drug use: see social history  Review of Systems - no fevers, unintentional weight loss, vision loss, hearing loss, chest pain, sob, hemoptysis, melena, hematochezia, hematuria, genital discharge, changing or concerning skin lesions, bleeding, bruising, loc, thoughts of self harm or SI  Past Medical History:  Diagnosis Date  . Anxiety and depression   . Elevated blood sugar    Hx of prediabetes  . GERD (gastroesophageal reflux disease)   . Hand eczema   . Hypothyroidism, iatrogenic    Follow by Dr. Bubba Camp, s/p partial thyroidectomy for nodule  . Migraine    hx cluster type    Past Surgical History:  Procedure Laterality Date  . BREAST SURGERY  1999   augmentation - saline  . COLONOSCOPY    . OPEN REDUCTION INTERNAL FIXATION (ORIF) DISTAL RADIAL FRACTURE Right 11/27/2014   Procedure: OPEN REDUCTION INTERNAL FIXATION (ORIF) RIGHT  DISTAL RADIAL FRACTURE WITH ALLOGRAFT BONE GRAFTING AND REPAIR;  Surgeon: Roseanne Kaufman, MD;  Location: [facility];  Service: Orthopedics;  Laterality: Right;  . right knee  2004   arthroscopic  . THYROID SURGERY  2011    Family History  Problem Relation Age of Onset  . Cancer Mother 73       breast, stomach ca  . Cancer Father        colon  . Colon cancer Father   . Colon cancer Paternal Grandmother   . Cancer Other        thyroid    Social History   Socioeconomic History  . Marital status: Married    Spouse name: Not on file  . Number of children: 2  . Years of education: Not on file  .  Highest education level: Not on file  Occupational History  . Occupation: PACE Optician, dispensing: [employer]  . Financial resource strain: Not on file  . Food insecurity:    Worry: Not on file    Inability: Not on file  . Transportation needs:    Medical: Not on file    Non-medical: Not on file  Tobacco Use  . Smoking status: Former Smoker    Last attempt to quit: 10/27/2000    Years since quitting: 17.6   . Smokeless tobacco: Never Used  . Tobacco comment: smoked for 10 years, quit in 2002  Substance and Sexual Activity  . Alcohol use: Yes    Alcohol/week: 1.0 standard drinks    Types: 1 Cans of beer per week    Comment: 1 drink every week  . Drug use: No  . Sexual activity: Not on file  Lifestyle  . Physical activity:    Days per week: Not on file    Minutes per session: Not on file  . Stress: Not on file  Relationships  . Social connections:    Talks on phone: Not on file    Gets together: Not on file    Attends religious service: Not on file    Active member of club or organization: Not on file    Attends meetings of clubs or organizations: Not on file    Relationship status: Not on file  Other Topics Concern  . 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  Ascorbic Acid (VITAMIN C) 1000 MG tablet, Take 1,000 mg by mouth daily. Reported on 03/17/2016, Disp: , Rfl:  .  aspirin-acetaminophen-caffeine (EXCEDRIN MIGRAINE) 250-250-65 MG per tablet, Take by mouth every 6 (six) hours as needed for headache., Disp: , Rfl:  .  CALCIUM CARBONATE PO, Take 500 mg by mouth. Reported on 03/17/2016, Disp: , Rfl:  .  famotidine (PEPCID) 10 MG tablet, Take 10 mg by mouth daily as needed for heartburn or indigestion., Disp: , Rfl:  .  fluticasone (FLONASE) 50 MCG/ACT nasal spray, Place 2 sprays into both nostrils daily., Disp: 16 g, Rfl: 6 .  levothyroxine (SYNTHROID, LEVOTHROID) 75 MCG tablet, Take 75 mcg by mouth daily before breakfast., Disp: , Rfl:  .  MAGNESIUM PO, Take 600 mg by mouth at bedtime., Disp: , Rfl:  .  methocarbamol (ROBAXIN) 500 MG tablet, Take 500 mg by mouth every 6 (six) hours. Reported on 03/17/2016, Disp: , Rfl:  .  ondansetron (ZOFRAN ODT) 4 MG disintegrating  tablet, Take 1 tablet (4 mg total) by mouth every 8 (eight) hours as needed for nausea or vomiting., Disp: 20 tablet, Rfl: 0 .  sertraline (ZOLOFT) 50 MG tablet, TAKE 1 TABLET BY MOUTH ONCE DAILY, Disp: 90 tablet, Rfl: 1 .  SUMAtriptan (IMITREX) 100 MG tablet, Take one tablet by mouth at onset migraine. May repeat once in 2 hours., Disp: 10 tablet, Rfl: 3  EXAM:  Vitals:   05/31/18 1105  BP: 104/60  Pulse: 85  Temp: 98.5 F (36.9 C)    GENERAL: vitals reviewed and listed below, alert, oriented, appears well hydrated and in no acute distress  HEENT: head atraumatic, PERRLA,  normal appearance of eyes, ears, nose and mouth. moist mucus membranes.  NECK: supple, no masses or lymphadenopathy  LUNGS: clear to auscultation bilaterally, no rales, rhonchi or wheeze  CV: HRRR, no peripheral edema or cyanosis, normal pedal pulses  ABDOMEN: bowel sounds normal, soft, non tender to palpation, no masses, no rebound or guarding  GU/BREAST: Declined, sees GYN  SKIN: no rash or abnormal lesions  MS: normal gait, moves all extremities normally  NEURO: normal gait, speech and thought processing grossly intact, muscle tone grossly intact throughout  PSYCH: normal affect, pleasant and cooperative  ASSESSMENT AND PLAN:  Discussed the following assessment and plan:  PREVENTIVE EXAM: -Discussed and advised all [place] preventive services health task force level A and B recommendations for age, sex and risks. -Advised at least 150 minutes of exercise per week and a healthy diet with avoidance of (less then 1 serving per week) processed foods, white starches, red meat, fast foods and sweets and consisting of: * 5-9 servings of fresh fruits and vegetables (not corn or potatoes) *nuts and seeds, beans *olives and olive oil *lean meats such as fish and white chicken  *whole grains -labs, studies and vaccines per orders this encounter   2. Anxiety and depression -brochure for CBT provided, she plans  to call for appt with keith, supported and counseled - sertraline (ZOLOFT) 50 MG tablet; Take 1 tablet (50 mg total) by mouth daily.  Dispense: 90 tablet; Refill: 1  3. Hypothyroidism, unspecified type -refills sent - TSH  4. Migraine without aura and without status migrainosus, not intractable -refills sent  5. Need for tetanus booster - Td vaccine greater than or equal to 7yo preservative free IM  6. Need for immunization against influenza - Flu Vaccine QUAD 6+ mos PF IM (Fluarix Quad PF)  7. Migraine without status migrainosus, not intractable, unspecified migraine type - SUMAtriptan (IMITREX) 100 MG tablet; Take one tablet by mouth at onset migraine. May repeat once in 2 hours.  Dispense: 10 tablet; Refill: 3   Patient advised to return to clinic immediately if symptoms worsen or persist or new concerns.  Patient Instructions  BEFORE YOU LEAVE: -tetanus booster, flu vaccine - provide print out -lab -follow up: 3-4 months  Call to schedule your mammogram.  Call to schedule counseling.  We have ordered labs or studies at this visit. It can take up to 1-2 weeks for results and processing. IF results require follow up or explanation, we will call you with instructions. Clinically stable results will be released to your [facility]. If you have not heard from [place] or cannot find your results in [facility] in 2 weeks please contact our office at [phone].  If you are not yet signed up for [facility], please consider signing up.   Preventive Care 40-64 Years, Female Preventive care refers to lifestyle choices and visits with your health care provider that can promote health and wellness. What does preventive care include?  A yearly physical exam. This is also called an annual well check.  Dental exams once or twice a year.  Routine eye exams. Ask your health care provider how often you should have your eyes checked.  Personal lifestyle choices, including: ? Daily care of your  teeth and gums. ? Regular physical activity. ? Eating a healthy diet. ? Avoiding tobacco and drug use. ? Limiting alcohol use. ? Practicing safe sex. ? Taking low-dose aspirin daily starting at age 10. ? Taking vitamin and mineral supplements as recommended by your health care provider.  What happens during an annual well check? The services and screenings done by your health care provider during your annual well check will depend on your age, overall health, lifestyle risk factors, and family history of disease. Counseling Your health care provider may ask you questions about your:  Alcohol use.  Tobacco use.  Drug use.  Emotional well-being.  Home and relationship well-being.  Sexual activity.  Eating habits.  Work and work Statistician.  Method of birth control.  Menstrual cycle.  Pregnancy history.  Screening You may have the following tests or measurements:  Height, weight, and BMI.  Blood pressure.  Lipid and cholesterol levels. These may be checked every 5 years, or more frequently if you are over 84 years old.  Skin check.  Lung cancer screening. You may have this screening every year starting at age 76 if you have a 30-pack-year history of smoking and currently smoke or have quit within the past 15 years.  Fecal occult blood test (FOBT) of the stool. You may have this test every year starting at age 32.  Flexible sigmoidoscopy or colonoscopy. You may have a sigmoidoscopy every 5 years or a colonoscopy every 10 years starting at age 68.  Hepatitis C blood test.  Hepatitis B blood test.  Sexually transmitted disease (STD) testing.  Diabetes screening. This is done by checking your blood sugar (glucose) after you have not eaten for a while (fasting). You may have this done every 1-3 years.  Mammogram. This may be done every 1-2 years. Talk to your health care provider about when you should start having regular mammograms. This may depend on whether you  have a family history of breast cancer.  BRCA-related cancer screening. This may be done if you have a family history of breast, ovarian, tubal, or peritoneal cancers.  Pelvic exam and Pap test. This may be done every 3 years starting at age 14. Starting at age 29, this may be done every 5 years if you have a Pap test in combination with an HPV test.  Bone density scan. This is done to screen for osteoporosis. You may have this scan if you are at high risk for osteoporosis.  Discuss your test results, treatment options, and if necessary, the need for more tests with your health care provider. Vaccines Your health care provider may recommend certain vaccines, such as:  Influenza vaccine. This is recommended every year.  Tetanus, diphtheria, and acellular pertussis (Tdap, Td) vaccine. You may need a Td booster every 10 years.  Varicella vaccine. You may need this if you have not been vaccinated.  Zoster vaccine. You may need this after age 29.  Measles, mumps, and rubella (MMR) vaccine. You may need at least one dose of MMR if you were born in 1957 or later. You may also need a second dose.  Pneumococcal 13-valent conjugate (PCV13) vaccine. You may need this if you have certain conditions and were not previously vaccinated.  Pneumococcal polysaccharide (PPSV23) vaccine. You may need one or two doses if you smoke cigarettes or if you have certain conditions.  Meningococcal vaccine. You may need this if you have certain conditions.  Hepatitis A vaccine. You may need this if you have certain conditions or if you travel or work in places where you may be exposed to hepatitis A.  Hepatitis B vaccine. You may need this if you have certain conditions or if you travel or work in places where you may be exposed to hepatitis B.  Haemophilus influenzae  type b (Hib) vaccine. You may need this if you have certain conditions.  Talk to your health care provider about which screenings and vaccines  you need and how often you need them. This information is not intended to replace advice given to you by your health care provider. Make sure you discuss any questions you have with your health care provider. Document Released: 09/20/2015 Document Revised: 05/13/2016 Document Reviewed: 06/25/2015 Elsevier Interactive Patient Education  2018 [employer].         No follow-ups on file.  Lucretia Kern, DO

## 2018-05-31 ENCOUNTER — Ambulatory Visit (INDEPENDENT_AMBULATORY_CARE_PROVIDER_SITE_OTHER): Payer: No Typology Code available for payment source | Admitting: Family Medicine

## 2018-05-31 ENCOUNTER — Encounter: Payer: Self-pay | Admitting: Family Medicine

## 2018-05-31 VITALS — BP 104/60 | HR 85 | Temp 98.5°F | Ht 70.0 in | Wt 148.8 lb

## 2018-05-31 DIAGNOSIS — G43909 Migraine, unspecified, not intractable, without status migrainosus: Secondary | ICD-10-CM

## 2018-05-31 DIAGNOSIS — E039 Hypothyroidism, unspecified: Secondary | ICD-10-CM

## 2018-05-31 DIAGNOSIS — F419 Anxiety disorder, unspecified: Secondary | ICD-10-CM

## 2018-05-31 DIAGNOSIS — F329 Major depressive disorder, single episode, unspecified: Secondary | ICD-10-CM

## 2018-05-31 DIAGNOSIS — G43009 Migraine without aura, not intractable, without status migrainosus: Secondary | ICD-10-CM

## 2018-05-31 DIAGNOSIS — Z0000 Encounter for general adult medical examination without abnormal findings: Secondary | ICD-10-CM | POA: Diagnosis not present

## 2018-05-31 DIAGNOSIS — Z23 Encounter for immunization: Secondary | ICD-10-CM

## 2018-05-31 LAB — TSH: TSH: 0.67 u[IU]/mL (ref 0.35–4.50)

## 2018-05-31 MED ORDER — SUMATRIPTAN SUCCINATE 100 MG PO TABS: ORAL_TABLET | ORAL | 3 refills | Status: DC

## 2018-05-31 MED ORDER — LEVOTHYROXINE SODIUM 75 MCG PO TABS
75.0000 ug | ORAL_TABLET | Freq: Every day | ORAL | 3 refills | Status: DC
Start: 2018-05-31 — End: 2019-07-14

## 2018-05-31 MED ORDER — SERTRALINE HCL 50 MG PO TABS: 50.0000 mg | ORAL_TABLET | Freq: Every day | ORAL | 1 refills | Status: DC

## 2018-05-31 MED FILL — SUMAtriptan SUCCINATE 100 M: 100 | 30 days supply | Qty: 8 | Fill #0

## 2018-05-31 NOTE — Patient Instructions (Addendum)
BEFORE YOU LEAVE: -tetanus booster, flu vaccine - provide print out -lab -follow up: 3-4 months  Call to schedule your mammogram.  Call to schedule counseling.  We have ordered labs or studies at this visit. It can take up to 1-2 weeks for results and processing. IF results require follow up or explanation, we will call you with instructions. Clinically stable results will be released to your [facility]. If you have not heard from [place] or cannot find your results in [facility] in 2 weeks please contact our office at [phone].  If you are not yet signed up for [facility], please consider signing up.   Preventive Care 40-64 Years, Female Preventive care refers to lifestyle choices and visits with your health care provider that can promote health and wellness. What does preventive care include?  A yearly physical exam. This is also called an annual well check.  Dental exams once or twice a year.  Routine eye exams. Ask your health care provider how often you should have your eyes checked.  Personal lifestyle choices, including: ? Daily care of your teeth and gums. ? Regular physical activity. ? Eating a healthy diet. ? Avoiding tobacco and drug use. ? Limiting alcohol use. ? Practicing safe sex. ? Taking low-dose aspirin daily starting at age 12. ? Taking vitamin and mineral supplements as recommended by your health care provider. What happens during an annual well check? The services and screenings done by your health care provider during your annual well check will depend on your age, overall health, lifestyle risk factors, and family history of disease. Counseling Your health care provider may ask you questions about your:  Alcohol use.  Tobacco use.  Drug use.  Emotional well-being.  Home and relationship well-being.  Sexual activity.  Eating habits.  Work and work Statistician.  Method of birth control.  Menstrual cycle.  Pregnancy history.  Screening You may  have the following tests or measurements:  Height, weight, and BMI.  Blood pressure.  Lipid and cholesterol levels. These may be checked every 5 years, or more frequently if you are over 75 years old.  Skin check.  Lung cancer screening. You may have this screening every year starting at age 73 if you have a 30-pack-year history of smoking and currently smoke or have quit within the past 15 years.  Fecal occult blood test (FOBT) of the stool. You may have this test every year starting at age 58.  Flexible sigmoidoscopy or colonoscopy. You may have a sigmoidoscopy every 5 years or a colonoscopy every 10 years starting at age 56.  Hepatitis C blood test.  Hepatitis B blood test.  Sexually transmitted disease (STD) testing.  Diabetes screening. This is done by checking your blood sugar (glucose) after you have not eaten for a while (fasting). You may have this done every 1-3 years.  Mammogram. This may be done every 1-2 years. Talk to your health care provider about when you should start having regular mammograms. This may depend on whether you have a family history of breast cancer.  BRCA-related cancer screening. This may be done if you have a family history of breast, ovarian, tubal, or peritoneal cancers.  Pelvic exam and Pap test. This may be done every 3 years starting at age 79. Starting at age 26, this may be done every 5 years if you have a Pap test in combination with an HPV test.  Bone density scan. This is done to screen for osteoporosis. You may have this scan  if you are at high risk for osteoporosis.  Discuss your test results, treatment options, and if necessary, the need for more tests with your health care provider. Vaccines Your health care provider may recommend certain vaccines, such as:  Influenza vaccine. This is recommended every year.  Tetanus, diphtheria, and acellular pertussis (Tdap, Td) vaccine. You may need a Td booster every 10 years.  Varicella  vaccine. You may need this if you have not been vaccinated.  Zoster vaccine. You may need this after age 9.  Measles, mumps, and rubella (MMR) vaccine. You may need at least one dose of MMR if you were born in 1957 or later. You may also need a second dose.  Pneumococcal 13-valent conjugate (PCV13) vaccine. You may need this if you have certain conditions and were not previously vaccinated.  Pneumococcal polysaccharide (PPSV23) vaccine. You may need one or two doses if you smoke cigarettes or if you have certain conditions.  Meningococcal vaccine. You may need this if you have certain conditions.  Hepatitis A vaccine. You may need this if you have certain conditions or if you travel or work in places where you may be exposed to hepatitis A.  Hepatitis B vaccine. You may need this if you have certain conditions or if you travel or work in places where you may be exposed to hepatitis B.  Haemophilus influenzae type b (Hib) vaccine. You may need this if you have certain conditions.  Talk to your health care provider about which screenings and vaccines you need and how often you need them. This information is not intended to replace advice given to you by your health care provider. Make sure you discuss any questions you have with your health care provider. Document Released: 09/20/2015 Document Revised: 05/13/2016 Document Reviewed: 06/25/2015 Elsevier Interactive Patient Education  [employer].

## 2018-06-21 MED FILL — SERTRALINE HCL 50 MG TABLET: 50 | 90 days supply | Qty: 90 | Fill #1

## 2018-06-22 ENCOUNTER — Ambulatory Visit: Payer: No Typology Code available for payment source | Admitting: Psychology

## 2018-06-22 DIAGNOSIS — F4321 Adjustment disorder with depressed mood: Secondary | ICD-10-CM | POA: Diagnosis not present

## 2018-07-04 MED FILL — LEVOTHYROXINE 75 MCG TABLET: 75 | 90 days supply | Qty: 90 | Fill #0

## 2018-07-05 ENCOUNTER — Ambulatory Visit: Payer: No Typology Code available for payment source | Admitting: Psychology

## 2018-07-05 DIAGNOSIS — F4321 Adjustment disorder with depressed mood: Secondary | ICD-10-CM

## 2018-07-19 ENCOUNTER — Ambulatory Visit: Payer: No Typology Code available for payment source | Admitting: Psychology

## 2018-08-11 MED FILL — SUMATRIPTAN SUCC 100 MG TAB: 100 | 30 days supply | Qty: 9 | Fill #0

## 2018-09-19 MED FILL — LEVOTHYROXINE 75 MCG TABLET: 75 | 90 days supply | Qty: 90 | Fill #0

## 2018-09-19 MED FILL — SERTRALINE HCL 50 MG TABLET: 50 | 90 days supply | Qty: 90 | Fill #0

## 2018-10-04 MED FILL — ALPRAZolam 0.25 MG TABS: 0.25 | 20 days supply | Qty: 60 | Fill #0

## 2018-10-04 MED FILL — PREMARIN VAGINAL CREAM-APPL: 0.625 | 60 days supply | Qty: 30 | Fill #0

## 2018-12-26 ENCOUNTER — Telehealth: Payer: Self-pay | Admitting: *Deleted

## 2018-12-26 ENCOUNTER — Encounter: Payer: Self-pay | Admitting: Family Medicine

## 2018-12-26 ENCOUNTER — Ambulatory Visit (INDEPENDENT_AMBULATORY_CARE_PROVIDER_SITE_OTHER): Payer: No Typology Code available for payment source | Admitting: Family Medicine

## 2018-12-26 ENCOUNTER — Other Ambulatory Visit: Payer: Self-pay

## 2018-12-26 DIAGNOSIS — N63 Unspecified lump in unspecified breast: Secondary | ICD-10-CM

## 2018-12-26 NOTE — Patient Instructions (Signed)
-  We placed a referral for you as discussed to the [facility] breast center for evaluation of the breast lump. It usually takes about 1-2 days to process and schedule this referral. If you have not heard from [place] regarding this appointment in 2 days please contact our office.  -please contact your gynecologist as well to let them know about this.

## 2018-12-26 NOTE — Telephone Encounter (Signed)
STAT order for [facility] completed and faxed to [phone].

## 2018-12-26 NOTE — Telephone Encounter (Signed)
-----   Message from Lucretia Kern, DO sent at 12/26/2018 10:26 AM EDT ----- Could you please place/call in stat orders for R diag mammo and [place] for Margarita Grizzle at [facility]? Thanks!

## 2018-12-26 NOTE — Telephone Encounter (Signed)
Per office notes from 4/20, I left a detailed message at the pts cell number stating the STAT order was faxed to [facility] and someone should be calling with appt info.  I also asked that she call back with details for mammo and pap to be updated in the chart.  CRM also created.

## 2018-12-26 NOTE — Progress Notes (Signed)
Virtual Visit via Video Note  I connected with Elizabeth Obrien  on 12/26/18 at 10:00 AM EDT by a video enabled telemedicine application and verified that I am speaking with the correct person using two identifiers.  Location patient: home Location provider:work or home office Persons participating in the virtual visit: patient, provider  I discussed the limitations of evaluation and management by telemedicine and the availability of in person appointments. The patient expressed understanding and agreed to proceed.   HPI:  Breast Lump: -she had implants - they were removed 1 year ago at [facility] -she does regular breast exams -had a little pain in breast a two weeks ago and noticed a small lump about the size of a pea in the R side -R lateral breast at nipple line, mobile, nontender -no skin changes  -no hx of breast ca, lumps or masses - but has fibrocystic breast disease -she has recently had a breast exam with her gynecologist and did start some premarin -no fevers, malaise, discharge, rashes   ROS: See pertinent positives and negatives per HPI.  Past Medical History:  Diagnosis Date  . Anxiety and depression   . Elevated blood sugar    Hx of prediabetes  . GERD (gastroesophageal reflux disease)   . Hand eczema   . Hypothyroidism, iatrogenic    Follow by Dr. Bubba Camp, s/p partial thyroidectomy for nodule  . Migraine    hx cluster type    Past Surgical History:  Procedure Laterality Date  . BREAST SURGERY  1999   augmentation - saline  . COLONOSCOPY    . OPEN REDUCTION INTERNAL FIXATION (ORIF) DISTAL RADIAL FRACTURE Right 11/27/2014   Procedure: OPEN REDUCTION INTERNAL FIXATION (ORIF) RIGHT  DISTAL RADIAL FRACTURE WITH ALLOGRAFT BONE GRAFTING AND REPAIR;  Surgeon: Roseanne Kaufman, MD;  Location: [facility];  Service: Orthopedics;  Laterality: Right;  . right knee  2004   arthroscopic  . THYROID SURGERY  2011    Family History  Problem Relation Age of Onset  .  Cancer Mother 16       breast, stomach ca  . Cancer Father        colon  . Colon cancer Father   . Colon cancer Paternal Grandmother   . Cancer Other        thyroid    SOCIAL HX: see hpi   Current Outpatient Medications:  .  Ascorbic Acid (VITAMIN C) 1000 MG tablet, Take 1,000 mg by mouth daily. Reported on 03/17/2016, Disp: , Rfl:  .  aspirin-acetaminophen-caffeine (EXCEDRIN MIGRAINE) 250-250-65 MG per tablet, Take by mouth every 6 (six) hours as needed for headache., Disp: , Rfl:  .  CALCIUM CARBONATE PO, Take 500 mg by mouth. Reported on 03/17/2016, Disp: , Rfl:  .  famotidine (PEPCID) 10 MG tablet, Take 10 mg by mouth daily as needed for heartburn or indigestion., Disp: , Rfl:  .  fluticasone (FLONASE) 50 MCG/ACT nasal spray, Place 2 sprays into both nostrils daily., Disp: 16 g, Rfl: 6 .  levothyroxine (SYNTHROID, LEVOTHROID) 75 MCG tablet, Take 1 tablet (75 mcg total) by mouth daily before breakfast., Disp: 90 tablet, Rfl: 3 .  MAGNESIUM PO, Take 600 mg by mouth at bedtime., Disp: , Rfl:  .  methocarbamol (ROBAXIN) 500 MG tablet, Take 500 mg by mouth every 6 (six) hours. Reported on 03/17/2016, Disp: , Rfl:  .  ondansetron (ZOFRAN ODT) 4 MG disintegrating tablet, Take 1 tablet (4 mg total) by mouth every 8 (eight) hours as  needed for nausea or vomiting., Disp: 20 tablet, Rfl: 0 .  sertraline (ZOLOFT) 50 MG tablet, Take 1 tablet (50 mg total) by mouth daily., Disp: 90 tablet, Rfl: 1 .  SUMAtriptan (IMITREX) 100 MG tablet, Take one tablet by mouth at onset migraine. May repeat once in 2 hours., Disp: 10 tablet, Rfl: 3  EXAM:  VITALS per patient if applicable:  GENERAL: alert, oriented, appears well and in no acute distress  HEENT: atraumatic, conjunttiva clear, no obvious abnormalities on inspection of external nose and ears  NECK: normal movements of the head and neck  LUNGS: on inspection no signs of respiratory distress, breathing rate appears normal, no obvious gross SOB,  gasping or wheezing  CV: no obvious cyanosis  Breasts: symetric, no rashes or visible swelling, lumps to skin changes, no nipple discharge, she points to an area at mid breast in the later edge at 9'Oclock, of the R breast as area of concern and she palpates a very small area approximately pea sized - she reports it is firm and mobile, she reports it is nontender, she palpated the rest of her breast and axillary region and denies any other masses or lumps  MS: moves all visible extremities without noticeable abnormality  PSYCH/NEURO: pleasant and cooperative, no obvious depression or anxiety, speech and thought processing grossly intact  ASSESSMENT AND PLAN:  Discussed the following assessment and plan:  Breast lump in female  -we discussed possible serious and likely etiologies, workup and treatment, treatment risks and return precautions -after this discussion, Elizabeth Obrien opted for urgent referral to [facility] breast center for evaluation of new breast lump - advised my assistant to call/place these orders. -advised patient to call us if does not have the details of this visit in the next 2 business days -also advised she notify her gynecologist whom typically does her breast exams and orders her mammograms -of course, we advised Elizabeth Obrien  to return or notify a doctor immediately if symptoms worsen or persist or new concerns arise. -she knows I will be leaving clinical practice and has a visit with Dr. Pati Gallo per her preference for TOC.   I discussed the assessment and treatment plan with the patient. The patient was provided an opportunity to ask questions and all were answered. The patient agreed with the plan and demonstrated an understanding of the instructions.    Follow up instructions: Advised assistant Wendie Simmer to help patient arrange the following: -place STAT orders to [facility] for R diagnostic mammo and [place] for new breast lump -please update pap and mammo as pt reports both done more  recently and incorrect in epic. Thanks!  Lucretia Kern, DO

## 2018-12-30 MED FILL — SERTRALINE HCL 50 MG TABLET: 50 | 90 days supply | Qty: 90 | Fill #1

## 2018-12-30 MED FILL — LEVOTHYROXINE 75 MCG TABLET: 75 | 90 days supply | Qty: 90 | Fill #1

## 2019-02-03 ENCOUNTER — Encounter: Payer: No Typology Code available for payment source | Admitting: Family Medicine

## 2019-02-10 ENCOUNTER — Encounter: Payer: Self-pay | Admitting: Family Medicine

## 2019-02-10 ENCOUNTER — Other Ambulatory Visit: Payer: Self-pay

## 2019-02-10 ENCOUNTER — Ambulatory Visit (INDEPENDENT_AMBULATORY_CARE_PROVIDER_SITE_OTHER): Payer: No Typology Code available for payment source | Admitting: Family Medicine

## 2019-02-10 VITALS — BP 124/74 | HR 86 | Temp 98.5°F | Resp 12 | Ht 70.0 in | Wt 143.2 lb

## 2019-02-10 DIAGNOSIS — Z130 Encounter for screening for diseases of the blood and blood-forming organs and certain disorders involving the immune mechanism: Secondary | ICD-10-CM

## 2019-02-10 DIAGNOSIS — Z0000 Encounter for general adult medical examination without abnormal findings: Secondary | ICD-10-CM | POA: Diagnosis not present

## 2019-02-10 DIAGNOSIS — Z13228 Encounter for screening for other metabolic disorders: Secondary | ICD-10-CM

## 2019-02-10 DIAGNOSIS — Z13220 Encounter for screening for lipoid disorders: Secondary | ICD-10-CM | POA: Diagnosis not present

## 2019-02-10 DIAGNOSIS — E039 Hypothyroidism, unspecified: Secondary | ICD-10-CM

## 2019-02-10 DIAGNOSIS — Z1329 Encounter for screening for other suspected endocrine disorder: Secondary | ICD-10-CM | POA: Diagnosis not present

## 2019-02-10 DIAGNOSIS — N952 Postmenopausal atrophic vaginitis: Secondary | ICD-10-CM | POA: Diagnosis not present

## 2019-02-10 DIAGNOSIS — R1031 Right lower quadrant pain: Secondary | ICD-10-CM | POA: Diagnosis not present

## 2019-02-10 LAB — BASIC METABOLIC PANEL
BUN: 13 mg/dL (ref 6–23)
CO2: 29 mEq/L (ref 19–32)
Calcium: 9.7 mg/dL (ref 8.4–10.5)
Chloride: 103 mEq/L (ref 96–112)
Creatinine, Ser: 0.73 mg/dL (ref 0.40–1.20)
GFR: 81.55 mL/min (ref >60.00)
Glucose, Bld: 84 mg/dL (ref 70–99)
Potassium: 4.6 mEq/L (ref 3.5–5.1)
Sodium: 140 mEq/L (ref 135–145)

## 2019-02-10 LAB — LIPID PANEL
Cholesterol: 193 mg/dL (ref 0–200)
HDL: 71 mg/dL (ref >39.00)
LDL Cholesterol: 110 mg/dL — ABNORMAL HIGH (ref 0–99)
NonHDL: 122.35
Total CHOL/HDL Ratio: 3
Triglycerides: 63 mg/dL (ref 0.0–149.0)
VLDL: 12.6 mg/dL (ref 0.0–40.0)

## 2019-02-10 LAB — TSH: TSH: 1.15 u[IU]/mL (ref 0.35–4.50)

## 2019-02-10 LAB — HEMOGLOBIN A1C: Hgb A1c MFr Bld: 5.8 % (ref 4.6–6.5)

## 2019-02-10 MED FILL — SUMATRIPTAN SUCC 100 MG TAB: 100 | 30 days supply | Qty: 9 | Fill #1

## 2019-02-10 NOTE — Patient Instructions (Addendum)
A few things to remember from today's visit:   Routine general medical examination at a health care facility  Hypothyroidism, unspecified type - Plan: TSH  Screening for lipoid disorders - Plan: Lipid panel  Screening for endocrine, metabolic and immunity disorder - Plan: Basic metabolic panel, Hemoglobin A1c Today you have you routine preventive visit.  At least 150 minutes of moderate exercise per week, daily brisk walking for 15-30 min is a good exercise option. Healthy diet low in saturated (animal) fats and sweets and consisting of fresh fruits and vegetables, lean meats such as fish and white chicken and whole grains.  These are some of recommendations for screening depending of age and risk factors:   - Vaccines:  Tdap vaccine every 10 years.  Shingles vaccine recommended at age 18, could be given after 59 years of age but not sure about insurance coverage.   Pneumonia vaccines:  Prevnar 13 at 65 and Pneumovax at 65. Sometimes Pneumovax is giving earlier if history of smoking, lung disease,diabetes,kidney disease among some.    Screening for diabetes at age 11 and every 3 years.  Cervical cancer prevention:  Pap smear starts at 59 years of age and continues periodically until 59 years old in low risk women. Pap smear every 3 years between 76 and 72 years old. Pap smear every 3-5 years between women 77 and older if pap smear negative and HPV screening negative.   -Breast cancer: Mammogram: There is disagreement between experts about when to start screening in low risk asymptomatic female but recent recommendations are to start screening at 61 and not later than 59 years old , every 1-2 years and after 59 yo q 2 years. Screening is recommended until 59 years old but some women can continue screening depending of healthy issues.   Colon cancer screening: starts at 59 years old until 59 years old.  Cholesterol disorder screening at age 40 and every 3 years.  Also  recommended:  1. Dental visit- Brush and floss your teeth twice daily; visit your dentist twice a year. 2. Eye doctor- Get an eye exam at least every 2 years. 3. Helmet use- Always wear a helmet when riding a bicycle, motorcycle, rollerblading or skateboarding. 4. Safe sex- If you may be exposed to sexually transmitted infections, use a condom. 5. Seat belts- Seat belts can save your live; always wear one. 6. Smoke/Carbon Monoxide detectors- These detectors need to be installed on the appropriate level of your home. Replace batteries at least once a year. 7. Skin cancer- When out in the sun please cover up and use sunscreen 15 SPF or higher. 8. Violence- If anyone is threatening or hurting you, please tell your healthcare provider.  9. Drink alcohol in moderation- Limit alcohol intake to one drink or less per day. Never drink and drive.  Please be sure medication list is accurate. If a new problem present, please set up appointment sooner than planned today.

## 2019-02-10 NOTE — Progress Notes (Signed)
HPI:   Ms.Elizabeth Obrien is a 58 y.o. female, who is here today to establish care with me.  Former PCP: Dr Maudie Mercury Last preventive routine visit: 05/2018  Chronic medical problems: Anxiety,depression,hypothyroidism,and GERD.  Hypothyroidism, she is on Levothyroxine 75 mcg daily. 05/2018 TSH was normal at 0.67. She denies palpitations,tremor,diarrhea/constipation,cold/heat intolerance. She has had some wt gain,decreased appetite. She is going through divorce, separated 6 months ago.  Depression and anxiety,she is on Zoloft 50 mg daily. She was also prescribed Alprazolam to help with sleep,she does not take it daily.   Concerns today: She wants to have a CPE today needed for insurance purposes.  She does not exercise regularly but she is active, does her yard work. She is not following a healthful diet,due to stress from separation she has been eating more.  She lives alone. Her daughter lives close by. She babysits her grandchild.  Last Pap smear 10/2018. She follows with her gyn periodically.   Mammogram: 2016, she is planning on scheduling one. Colonoscopy: 03/2016, 5 years follow up was recommended.   Concerns today: Vaginal dryness and pruritus.  Hx of atrophic vaginitis,her gyn recommended Premarin vaginal cream 3 times per week.Medication helps with problem but it is causing migraine headaches. Right fronto-temporal sharp headache,associated with N/V and photophobia. Headache is proceeded by visual aura, "color pattens" in visual field that lasts 15-20 min. Premarin also helps with urine incontinence.  Hx of migraines , resolved after menopause.  She is also concerned about possible enlarged lymph nodes in inguinal area,noted a few days ago.She has some mild discomfort in right groin, intermittent,not sure about exacerbating or alleviating factors. Negative for abdominal pain,vaginal discharge or abnormal bleeding, or genital lesions.   Review of Systems   Constitutional: Positive for appetite change and fatigue. Negative for chills and fever.  HENT: Negative for dental problem, hearing loss, mouth sores, sore throat, trouble swallowing and voice change.   Eyes: Negative for redness and visual disturbance.  Respiratory: Negative for cough, shortness of breath and wheezing.   Cardiovascular: Negative for chest pain and leg swelling.  Gastrointestinal: Negative for abdominal pain, nausea and vomiting.       No changes in bowel habits.  Endocrine: Negative for cold intolerance, heat intolerance, polydipsia, polyphagia and polyuria.  Genitourinary: Negative for decreased urine volume, dysuria and hematuria.  Musculoskeletal: Negative for arthralgias and gait problem.  Skin: Negative for color change and rash.  Allergic/Immunologic: Positive for environmental allergies.  Neurological: Positive for headaches. Negative for syncope and weakness.  Hematological: Negative for adenopathy. Does not bruise/bleed easily.  Psychiatric/Behavioral: Positive for sleep disturbance. Negative for confusion and suicidal ideas. The patient is nervous/anxious.   All other systems reviewed and are negative.   Current Outpatient Medications on File Prior to Visit  Medication Sig Dispense Refill  . ALPRAZolam (XANAX) 0.25 MG tablet alprazolam 0.25 mg tablet  Take 1 tablet 3 times a day by oral route as needed.    . Ascorbic Acid (VITAMIN C) 1000 MG tablet Take 1,000 mg by mouth daily. Reported on 03/17/2016    . aspirin-acetaminophen-caffeine (EXCEDRIN MIGRAINE) 250-250-65 MG per tablet Take by mouth every 6 (six) hours as needed for headache.    Marland Kitchen CALCIUM CARBONATE PO Take 500 mg by mouth. Reported on 03/17/2016    . conjugated estrogens (PREMARIN) vaginal cream Premarin 0.625 mg/gram vaginal cream  Insert 0.5 applicatorsful every day by vaginal route.    . famotidine (PEPCID) 10 MG tablet Take 10 mg  by mouth daily as needed for heartburn or indigestion.    .  fluticasone (FLONASE) 50 MCG/ACT nasal spray Place 2 sprays into both nostrils daily. 16 g 6  . levothyroxine (SYNTHROID, LEVOTHROID) 75 MCG tablet Take 1 tablet (75 mcg total) by mouth daily before breakfast. 90 tablet 3  . MAGNESIUM PO Take 600 mg by mouth at bedtime.    . methocarbamol (ROBAXIN) 500 MG tablet Take 500 mg by mouth every 6 (six) hours. Reported on 03/17/2016    . ondansetron (ZOFRAN ODT) 4 MG disintegrating tablet Take 1 tablet (4 mg total) by mouth every 8 (eight) hours as needed for nausea or vomiting. 20 tablet 0  . sertraline (ZOLOFT) 50 MG tablet Take 1 tablet (50 mg total) by mouth daily. 90 tablet 1  . SUMAtriptan (IMITREX) 100 MG tablet Take one tablet by mouth at onset migraine. May repeat once in 2 hours. 10 tablet 3   No current facility-administered medications on file prior to visit.      Past Medical History:  Diagnosis Date  . Anxiety and depression   . Elevated blood sugar    Hx of prediabetes  . GERD (gastroesophageal reflux disease)   . Hand eczema   . Hypothyroidism, iatrogenic    Follow by Dr. Bubba Camp, s/p partial thyroidectomy for nodule  . Migraine    hx cluster type   Allergies  Allergen Reactions  . Other Hives and Rash    Topical antibiotics    Family History  Problem Relation Age of Onset  . Cancer Mother 42       breast, stomach ca  . Cancer Father        colon  . Colon cancer Father   . Colon cancer Paternal Grandmother   . Cancer Other        thyroid    Social History   Socioeconomic History  . Marital status: Married    Spouse name: Not on file  . Number of children: 2  . Years of education: Not on file  . Highest education level: Not on file  Occupational History  . 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 Financial resource strain: Not on file  . Food insecurity:    Worry: Not on file    Inability: Not on file  . Transportation needs:    Medical: Not on file    Non-medical: Not on file   Tobacco Use  . Smoking status: Former Smoker    Last attempt to quit: 10/27/2000    Years since quitting: 18.3  . Smokeless tobacco: Never Used  . Tobacco comment: smoked for 10 years, quit in 2002  Substance and Sexual Activity  . Alcohol use: Yes    Alcohol/week: 1.0 standard drinks    Types: 1 Cans of beer per week    Comment: 1 drink every week  . Drug use: No  . Sexual activity: Not on file  Lifestyle  . Physical activity:    Days per week: Not on file    Minutes per session: Not on file  . Stress: Not on file  Relationships  . Social connections:    Talks on phone: Not on file    Gets together: Not on file    Attends religious service: Not on file    Active member of club or organization: Not on file    Attends meetings of clubs or organizations: Not on file    Relationship status: Not  on file  Other Topics Concern  . 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  Physical Exam  Nursing note and vitals reviewed. Constitutional: She is oriented to person, place, and time. She appears well-developed. No distress.  HENT:  Head: Normocephalic and atraumatic.  Right Ear: Hearing, tympanic membrane, external ear and ear canal normal.  Left Ear: Hearing, tympanic membrane, external ear and ear canal normal.  Mouth/Throat: Uvula is midline, oropharynx is clear and moist and mucous membranes are normal.  Eyes: Pupils are equal, round, and reactive to light. Conjunctivae and EOM are normal.  Neck: No tracheal deviation present. No thyromegaly present.  Cardiovascular: Normal rate and regular rhythm.  No murmur heard. Pulses:      Dorsalis pedis pulses are 2+ on the  right side and 2+ on the left side.  Respiratory: Effort normal and breath sounds normal. No respiratory distress.  GI: Soft. She exhibits no mass. There is no hepatomegaly. There is no abdominal tenderness. Hernia confirmed negative in the right inguinal area and confirmed negative in the left inguinal area.  Mild "discomfort" upon deep palpation of right inguinal area.  Genitourinary:    Genitourinary Comments: Deferred to gyn.   Musculoskeletal:        General: No edema.     Comments: No major deformity or signs of synovitis appreciated.  Lymphadenopathy:    She has no cervical adenopathy.       Right: No inguinal and no supraclavicular adenopathy present.       Left: No inguinal and no supraclavicular adenopathy present.  Neurological: She is alert and oriented to person, place, and time. She has normal strength. No cranial nerve deficit. Coordination and gait normal.  Reflex Scores:      Bicep reflexes are 2+ on the right side and 2+ on the left side.      Patellar reflexes are 2+ on the right side and 2+ on the left side. Skin: Skin is warm. No rash noted. No erythema.  Psychiatric: Her mood appears anxious. Her affect is labile.  Fairly groomed, good eye contact.    ASSESSMENT AND PLAN:  Ms. Eurydice was seen today for transfer of care and annual exam.  Diagnoses and all orders for this visit: Orders Placed This Encounter  Procedures  . Lipid panel  . Basic metabolic panel  . Hemoglobin A1c  . TSH   Lab Results  Component Value Date   TSH 1.15 02/10/2019   Lab Results  Component Value Date   CHOL 193 02/10/2019   HDL 71.00 02/10/2019   LDLCALC 110 (H) 02/10/2019   TRIG 63.0 02/10/2019   CHOLHDL 3 02/10/2019   Lab Results  Component Value Date   CREATININE 0.73 02/10/2019   BUN 13 02/10/2019   NA 140 02/10/2019   K 4.6 02/10/2019   CL 103 02/10/2019   CO2 29 02/10/2019   Lab Results  Component Value Date   HGBA1C 5.8 02/10/2019    Routine general  medical examination at a health care facility We discussed the importance  of regular physical activity and healthy diet for prevention of chronic illness and/or complications. Preventive guidelines reviewed. Continue following with her gyn for ehr female preventive care. She is going to arrange mammogram. Vaccination up to date.  Ca++ and vit D supplementation recommended. Next CPE in a year.  The 10-year ASCVD risk score Mikey Bussing DC Brooke Bonito., et al., 2013) is: 2.3%   Values used to calculate the score:     Age: 23 years     Sex: Female     Is Non-Hispanic African American: No     Diabetic: No     Tobacco smoker: No     Systolic Blood Pressure: 157 mmHg     Is BP treated: No     HDL Cholesterol: 71 mg/dL     Total Cholesterol: 193 mg/dL   Hypothyroidism, unspecified type No changes in current management, will follow labs done today and will give further recommendations accordingly. F/U in a year,before if needed.  -     TSH Screening for lipoid disorders -     Lipid panel  Screening for endocrine, metabolic and immunity disorder -     Basic metabolic panel -     Hemoglobin A1c  Atrophic vaginitis Some side effects of Premarin discussed. Other treatment options discussed,including oral meds as Osphena; as well as side effects. OTC [place] gel may also help. Recommend trying topical Premarin once per week instead 3 times. Continue following with gyn.  Groin pain, right Unspecific. We discussed possible etiologies. I do not appreciate adenopathies or hernia defects. Recommend to continue monitoring for worsening or new associated symptoms. Instructed about warning signs.  Anxiety and depression,no changes in current management. Continue following with gyn.   Return in 1 year (on 02/10/2020) for cpe and f/u.     Golden Emile G. Martinique, MD  [facility]. [place] office.

## 2019-02-11 ENCOUNTER — Encounter: Payer: Self-pay | Admitting: Family Medicine

## 2019-04-07 ENCOUNTER — Other Ambulatory Visit: Payer: Self-pay | Admitting: Family Medicine

## 2019-04-07 DIAGNOSIS — F32A Depression, unspecified: Secondary | ICD-10-CM

## 2019-04-07 DIAGNOSIS — F329 Major depressive disorder, single episode, unspecified: Secondary | ICD-10-CM

## 2019-04-07 MED FILL — LEVOTHYROXINE 75 MCG TABLET: 75 | 90 days supply | Qty: 90 | Fill #2

## 2019-04-10 MED FILL — SERTRALINE HCL 50 MG TABLET: 50 | 90 days supply | Qty: 90 | Fill #0

## 2019-06-05 ENCOUNTER — Other Ambulatory Visit: Payer: Self-pay | Admitting: Family Medicine

## 2019-06-05 DIAGNOSIS — G43909 Migraine, unspecified, not intractable, without status migrainosus: Secondary | ICD-10-CM

## 2019-06-05 MED FILL — PREMARIN VAGINAL CREAM-APPL: 0.625 | 60 days supply | Qty: 30 | Fill #1

## 2019-06-06 NOTE — Telephone Encounter (Signed)
Patient is requesting refill on imitrex. It looks like it was last prescribed by Dr. Maudie Mercury. Please advise, thank you

## 2019-06-16 MED FILL — SUMATRIPTAN SUCC 100 MG TAB: 100 | 30 days supply | Qty: 9 | Fill #0

## 2019-06-16 NOTE — Telephone Encounter (Signed)
° °  Pt is still waiting on her RX for the below medication  SUMAtriptan (IMITREX) 100 MG tablet   Pt said she had a CPE with Dr Martinique in June and need someone to fill this medication   Pharmacy [facility]

## 2019-07-14 ENCOUNTER — Other Ambulatory Visit: Payer: Self-pay | Admitting: Family Medicine

## 2019-07-14 MED FILL — SERTRALINE HCL 50 MG TABLET: 50 | 90 days supply | Qty: 90 | Fill #1

## 2019-07-14 MED FILL — LEVOTHYROXINE 75 MCG TABLET: 75 | 90 days supply | Qty: 90 | Fill #0

## 2019-07-21 ENCOUNTER — Telehealth (INDEPENDENT_AMBULATORY_CARE_PROVIDER_SITE_OTHER): Payer: No Typology Code available for payment source | Admitting: Family Medicine

## 2019-07-21 ENCOUNTER — Other Ambulatory Visit: Payer: Self-pay

## 2019-07-21 DIAGNOSIS — M545 Low back pain, unspecified: Secondary | ICD-10-CM

## 2019-07-21 MED ORDER — METHOCARBAMOL 500 MG PO TABS: 500.0000 mg | ORAL_TABLET | Freq: Three times a day (TID) | ORAL | 0 refills | Status: AC | PRN

## 2019-07-21 MED ORDER — IBUPROFEN 600 MG PO TABS: 600.0000 mg | ORAL_TABLET | Freq: Three times a day (TID) | ORAL | 0 refills | Status: AC | PRN

## 2019-07-21 MED FILL — IBUPROFEN 600 MG TABLET: 600 | 10 days supply | Qty: 30 | Fill #0

## 2019-07-21 MED FILL — METHOCARBAMOL 500 MG TABS: 500 | 15 days supply | Qty: 45 | Fill #0

## 2019-07-21 NOTE — Progress Notes (Addendum)
Virtual Visit via Video Note   I connected with Elizabeth Obrien on 07/21/19 by a video enabled telemedicine application and verified that I am speaking with the correct person using two identifiers.  Location patient: home Location provider:work office Persons participating in the virtual visit: patient, provider  I discussed the limitations of evaluation and management by telemedicine and the availability of in person appointments. The patient expressed understanding and agreed to proceed.   HPI: Elizabeth Obrien is a 58 yo female with hx of anxiety and depression c/o 10 days of severe right lower back pain. Pain started a day after she did yard work,digging. She denies any recent injury or unusual level of activity.  Pain is not radiated, achy and sometimes "shooting", 3/10 in intensity, with no associated LE numbness, tingling, urinary incontinence or retention, stool incontinence, or saddle anesthesia. States that initially she could not walk,did not make it to the bathroom a couple times due to pain. It is improving.  Exacerbated by certain movements.  It seems to be worse in the morning. Alleviated by rest, position changes. No rash or edema on area, fever, chills, or abnormal wt loss.  Prior Hx of back pain: "Not like this."Denies Hx of back pain.   OTC medications: She has taking OTC ibuprofen, 1000 mg daily.  She also has taking methocarbamol left from another prescription.  She has not been at work since Monday.  ROS: See pertinent positives and negatives per HPI.  Past Medical History:  Diagnosis Date  . Anxiety and depression   . Elevated blood sugar    Hx of prediabetes  . GERD (gastroesophageal reflux disease)   . Hand eczema   . Hypothyroidism, iatrogenic    Follow by Dr. Bubba Camp, s/p partial thyroidectomy for nodule  . Migraine    hx cluster type    Past Surgical History:  Procedure Laterality Date  . BREAST SURGERY  1999   augmentation - saline  . COLONOSCOPY     . OPEN REDUCTION INTERNAL FIXATION (ORIF) DISTAL RADIAL FRACTURE Right 11/27/2014   Procedure: OPEN REDUCTION INTERNAL FIXATION (ORIF) RIGHT  DISTAL RADIAL FRACTURE WITH ALLOGRAFT BONE GRAFTING AND REPAIR;  Surgeon: Roseanne Kaufman, MD;  Location: [facility];  Service: Orthopedics;  Laterality: Right;  . right knee  2004   arthroscopic  . THYROID SURGERY  2011    Family History  Problem Relation Age of Onset  . Cancer Mother 75       breast, stomach ca  . Cancer Father        colon  . Colon cancer Father   . Colon cancer Paternal Grandmother   . Cancer Other        thyroid    Social History   Socioeconomic History  . Marital status: Married    Spouse name: Not on file  . Number of children: 2  . Years of education: Not on file  . Highest education level: Not on file  Occupational History  . Occupation: PACE Optician, dispensing: [employer]  . Financial resource strain: Not on file  . Food insecurity    Worry: Not on file    Inability: Not on file  . Transportation needs    Medical: Not on file    Non-medical: Not on file  Tobacco Use  . Smoking status: Former Smoker    Quit date: 10/27/2000    Years since quitting: 18.7  . Smokeless tobacco: Never Used  . Tobacco comment: smoked  for 10 years, quit in 2002  Substance and Sexual Activity  . Alcohol use: Yes    Alcohol/week: 1.0 standard drinks    Types: 1 Cans of beer per week    Comment: 1 drink every week  . Drug use: No  . Sexual activity: Not on file  Lifestyle  . Physical activity    Days per week: Not on file    Minutes per session: Not on file  . Stress: Not on file  Relationships  . Social Herbalist on phone: Not on file    Gets together: Not on file    Attends religious service: Not on file    Active member of club or organization: Not on file    Attends meetings of clubs or organizations: Not on file    Relationship status: Not on file  . Intimate partner violence    Fear of  current or ex partner: Not on file    Emotionally abused: Not on file    Physically abused: Not on file    Forced sexual activity: Not on file  Other Topics Concern  . 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  ALPRAZolam (XANAX) 0.25 MG tablet, alprazolam 0.25 mg tablet  Take 1 tablet 3 times a day by oral route as needed., Disp: , Rfl:  .  Ascorbic Acid (VITAMIN C) 1000 MG tablet, Take 1,000 mg by mouth daily. Reported on 03/17/2016, Disp: , Rfl:  .  aspirin-acetaminophen-caffeine (EXCEDRIN MIGRAINE) 250-250-65 MG per tablet, Take by mouth every 6 (six) hours as needed for headache., Disp: , Rfl:  .  CALCIUM CARBONATE PO, Take 500 mg by mouth. Reported on 03/17/2016, Disp: , Rfl:  .  conjugated estrogens (PREMARIN) vaginal cream, Premarin 0.625 mg/gram vaginal cream  Insert 0.5 applicatorsful every day by vaginal route., Disp: , Rfl:  .  famotidine (PEPCID) 10 MG tablet, Take 10 mg by mouth daily as needed for heartburn or indigestion., Disp: , Rfl:  .  fluticasone (FLONASE) 50 MCG/ACT nasal spray, Place 2 sprays into both nostrils daily., Disp: 16 g, Rfl: 6 .  ibuprofen (ADVIL) 600 MG tablet, Take 1 tablet (600 mg total) by mouth every 8 (eight) hours as needed for up to 10 days., Disp: 30 tablet, Rfl: 0 .  levothyroxine (SYNTHROID) 75 MCG tablet, TAKE 1 TABLET (75 MCG TOTAL) BY MOUTH DAILY BEFORE BREAKFAST., Disp: 90 tablet, Rfl: 3 .  MAGNESIUM PO, Take 600 mg by mouth at bedtime., Disp: , Rfl:  .  methocarbamol (ROBAXIN) 500 MG tablet, Take 1 tablet (500 mg total) by mouth every 8 (eight) hours as needed for up to 15 days for muscle spasms. Reported on 03/17/2016, Disp: 45 tablet, Rfl: 0 .  ondansetron (ZOFRAN ODT) 4 MG disintegrating tablet, Take 1 tablet (4 mg total) by mouth  every 8 (eight) hours as needed for nausea or vomiting., Disp: 20 tablet, Rfl: 0 .  sertraline (ZOLOFT) 50 MG tablet, TAKE 1 TABLET (50 MG TOTAL) BY MOUTH DAILY., Disp: 90 tablet, Rfl: 2 .  SUMAtriptan (IMITREX) 100 MG tablet, TAKE 1 TABLET BY MOUTH AT ONSET MIGRAINE. MAY REPEAT ONCE IN 2 HOURS. PATIENT NEEDS APPOINTMENT LAST SEEN 2016, Disp:  9 tablet, Rfl: 3  EXAM:  VITALS per patient if applicable:N/A  GENERAL: alert, oriented, appears well and in no acute distress  HEENT: atraumatic, conjunctiva clear, no obvious abnormalities on inspection.  NECK: normal movements of the head and neck  LUNGS: on inspection no signs of respiratory distress, breathing rate appears normal, no obvious gross SOB, gasping or wheezing  CV: no obvious cyanosis  Elizabeth: moves all visible extremities without noticeable abnormality, antalgic position while seated.  PSYCH/NEURO: pleasant and cooperative, no obvious depression or anxiety, speech and thought processing grossly intact  ASSESSMENT AND PLAN:  Discussed the following assessment and plan:  Acute right-sided low back pain without sciatica - Plan: ibuprofen (ADVIL) 600 MG tablet, methocarbamol (ROBAXIN) 500 MG tablet  New problem. We discussed possible etiologies. Because there is no history of trauma, I do not think imaging is needed at this time. We discussed some side effects of NSAIDs as well as appropriate dose. She will continue ibuprofen 600 mg 3 times daily as needed with food and methocarbamol 3 times daily as needed. Excuse note for work will be provided from 07/17/2019 to 07/24/2019. She was clearly instructed about warning signs. If pain is still persistent after 4 weeks, we may consider PT.    I discussed the assessment and treatment plan with the patient. She was provided an opportunity to ask questions and all were answered. She agreed with the plan and demonstrated an understanding of the instructions.   The patient was advised to  call back or seek an in-person evaluation if the symptoms worsen or if the condition fails to improve as anticipated.  Return if symptoms worsen or fail to improve.     [place], MD

## 2019-07-25 ENCOUNTER — Telehealth: Payer: Self-pay | Admitting: Family Medicine

## 2019-07-25 NOTE — Telephone Encounter (Signed)
The patient had FMLA forms faxed   Fax forms to: [phone]  Disposition: Dr's Folder

## 2019-10-23 MED FILL — LEVOTHYROXINE 75 MCG TABLET: 75 | 90 days supply | Qty: 90 | Fill #1

## 2019-10-23 MED FILL — SERTRALINE HCL 50 MG TABLET: 50 | 90 days supply | Qty: 90 | Fill #2

## 2019-12-13 ENCOUNTER — Other Ambulatory Visit (HOSPITAL_COMMUNITY): Payer: Self-pay | Admitting: Radiology

## 2019-12-13 DIAGNOSIS — E079 Disorder of thyroid, unspecified: Secondary | ICD-10-CM | POA: Insufficient documentation

## 2019-12-13 MED FILL — PREMARIN VAGINAL CREAM-APPL: 0.625 | 30 days supply | Qty: 30 | Fill #0

## 2019-12-13 MED FILL — ALPRAZolam 0.25 MG TABS: 0.25 | 20 days supply | Qty: 60 | Fill #0

## 2019-12-13 MED FILL — SERTRALINE HCL 100 MG TAB: 100 | 90 days supply | Qty: 90 | Fill #0

## 2020-01-15 ENCOUNTER — Other Ambulatory Visit (HOSPITAL_COMMUNITY): Payer: Self-pay | Admitting: Radiology

## 2020-01-15 DIAGNOSIS — M810 Age-related osteoporosis without current pathological fracture: Secondary | ICD-10-CM | POA: Insufficient documentation

## 2020-02-06 MED FILL — LEVOTHYROXINE 75 MCG TABLET: 75 | 90 days supply | Qty: 90 | Fill #2

## 2020-04-26 MED FILL — ALENDRONATE NA 70 MG TAB: 70 | 84 days supply | Qty: 12 | Fill #1

## 2020-04-26 MED FILL — SUMATRIPTAN SUCCINATE 100 M: 100 | 30 days supply | Qty: 9 | Fill #1

## 2020-05-06 MED FILL — SERTRALINE HCL 100 MG TABS: 100 | 90 days supply | Qty: 90 | Fill #1

## 2020-05-06 MED FILL — LEVOTHYROXINE 75 MCG TABLET: 75 | 90 days supply | Qty: 90 | Fill #3

## 2020-07-02 MED FILL — ALENDRONATE NA 70 MG TAB: 70 | 84 days supply | Qty: 12 | Fill #2

## 2020-08-12 ENCOUNTER — Other Ambulatory Visit: Payer: Self-pay | Admitting: Family Medicine

## 2020-08-12 MED FILL — SERTRALINE HCL 100 MG TABS: 100 | 90 days supply | Qty: 90 | Fill #2

## 2020-08-12 MED FILL — LEVOTHYROXINE 75 MCG TABLET: 75 | 90 days supply | Qty: 90 | Fill #0

## 2020-10-22 MED FILL — ALENDRONATE NA 70 MG TAB: 70 | 84 days supply | Qty: 12 | Fill #3

## 2020-11-14 ENCOUNTER — Other Ambulatory Visit: Payer: Self-pay

## 2020-11-15 ENCOUNTER — Encounter: Payer: Self-pay | Admitting: Family Medicine

## 2020-11-15 ENCOUNTER — Ambulatory Visit (INDEPENDENT_AMBULATORY_CARE_PROVIDER_SITE_OTHER): Payer: No Typology Code available for payment source | Admitting: Family Medicine

## 2020-11-15 ENCOUNTER — Other Ambulatory Visit: Payer: Self-pay | Admitting: Family Medicine

## 2020-11-15 VITALS — BP 126/70 | HR 82 | Resp 12 | Ht 70.0 in | Wt 159.0 lb

## 2020-11-15 DIAGNOSIS — Z0000 Encounter for general adult medical examination without abnormal findings: Secondary | ICD-10-CM

## 2020-11-15 DIAGNOSIS — E039 Hypothyroidism, unspecified: Secondary | ICD-10-CM | POA: Diagnosis not present

## 2020-11-15 DIAGNOSIS — Z13220 Encounter for screening for lipoid disorders: Secondary | ICD-10-CM

## 2020-11-15 DIAGNOSIS — Z13228 Encounter for screening for other metabolic disorders: Secondary | ICD-10-CM

## 2020-11-15 DIAGNOSIS — Z1329 Encounter for screening for other suspected endocrine disorder: Secondary | ICD-10-CM | POA: Diagnosis not present

## 2020-11-15 DIAGNOSIS — Z130 Encounter for screening for diseases of the blood and blood-forming organs and certain disorders involving the immune mechanism: Secondary | ICD-10-CM | POA: Diagnosis not present

## 2020-11-15 DIAGNOSIS — G8929 Other chronic pain: Secondary | ICD-10-CM

## 2020-11-15 DIAGNOSIS — Z23 Encounter for immunization: Secondary | ICD-10-CM | POA: Diagnosis not present

## 2020-11-15 DIAGNOSIS — G43909 Migraine, unspecified, not intractable, without status migrainosus: Secondary | ICD-10-CM | POA: Diagnosis not present

## 2020-11-15 DIAGNOSIS — M25511 Pain in right shoulder: Secondary | ICD-10-CM

## 2020-11-15 LAB — TSH: TSH: 1.97 u[IU]/mL (ref 0.35–4.50)

## 2020-11-15 LAB — LIPID PANEL
Cholesterol: 194 mg/dL (ref 0–200)
HDL: 74.7 mg/dL (ref >39.00)
LDL Cholesterol: 108 mg/dL — ABNORMAL HIGH (ref 0–99)
NonHDL: 119.14
Total CHOL/HDL Ratio: 3
Triglycerides: 55 mg/dL (ref 0.0–149.0)
VLDL: 11 mg/dL (ref 0.0–40.0)

## 2020-11-15 LAB — COMPREHENSIVE METABOLIC PANEL
ALT: 12 U/L (ref 0–35)
AST: 19 U/L (ref 0–37)
Albumin: 4 g/dL (ref 3.5–5.2)
Alkaline Phosphatase: 62 U/L (ref 39–117)
BUN: 13 mg/dL (ref 6–23)
CO2: 30 mEq/L (ref 19–32)
Calcium: 9.4 mg/dL (ref 8.4–10.5)
Chloride: 104 mEq/L (ref 96–112)
Creatinine, Ser: 0.77 mg/dL (ref 0.40–1.20)
GFR: 83.54 mL/min (ref >60.00)
Glucose, Bld: 88 mg/dL (ref 70–99)
Potassium: 4.3 mEq/L (ref 3.5–5.1)
Sodium: 139 mEq/L (ref 135–145)
Total Bilirubin: 0.7 mg/dL (ref 0.2–1.2)
Total Protein: 7.2 g/dL (ref 6.0–8.3)

## 2020-11-15 LAB — HEMOGLOBIN A1C: Hgb A1c MFr Bld: 5.6 % (ref 4.6–6.5)

## 2020-11-15 MED ORDER — SUMATRIPTAN SUCCINATE 100 MG PO TABS: ORAL_TABLET | ORAL | 3 refills | Status: DC

## 2020-11-15 MED FILL — SUMATRIPTAN SUCCINATE 100 M: 100 | 30 days supply | Qty: 9 | Fill #0

## 2020-11-15 NOTE — Patient Instructions (Addendum)
Today you have you routine preventive visit. A few things to remember from today's visit:  Routine general medical examination at a health care facility  Migraine without status migrainosus, not intractable, unspecified migraine type - Plan: SUMAtriptan (IMITREX) 100 MG tablet  Hypothyroidism, unspecified type - Plan: TSH  Screening for lipoid disorders - Plan: Lipid panel  Screening for endocrine, metabolic and immunity disorder - Plan: Comprehensive metabolic panel, Hemoglobin A1c  Chronic right shoulder pain - Plan: Ambulatory referral to Orthopedic Surgery  If you need refills please call your pharmacy. Do not use My Chart to request refills or for acute issues that need immediate attention.   Please be sure medication list is accurate. If a new problem present, please set up appointment sooner than planned today.  At least 150 minutes of moderate exercise per week, daily brisk walking for 15-30 min is a good exercise option. Healthy diet low in saturated (animal) fats and sweets and consisting of fresh fruits and vegetables, lean meats such as fish and white chicken and whole grains.  These are some of recommendations for screening depending of age and risk factors:  - Vaccines:  Tdap vaccine every 10 years.  Shingles vaccine recommended at age 73, could be given after 61 years of age but not sure about insurance coverage. Given today.  Pneumonia vaccines: Pneumovax at 33. Sometimes Pneumovax is giving earlier if history of smoking, lung disease,diabetes,kidney disease among some.  Screening for diabetes at age 76 and every 3 years.  Cervical cancer prevention:  Pap smear starts at 61 years of age and continues periodically until 61 years old in low risk women. Pap smear every 3 years between 51 and 58 years old. Pap smear every 3-5 years between women 92 and older if pap smear negative and HPV screening negative.   -Breast cancer: Mammogram: There is disagreement  between experts about when to start screening in low risk asymptomatic female but recent recommendations are to start screening at 48 and not later than 61 years old , every 1-2 years and after 61 yo q 2 years. Screening is recommended until 61 years old but some women can continue screening depending of healthy issues.  Colon cancer screening: Has been recently changed to 61 yo. Insurance may not cover until you are 61 years old. Screening is recommended until 61 years old.  Cholesterol disorder screening at age 36 and every 3 years.  Also recommended:  1. Dental visit- Brush and floss your teeth twice daily; visit your dentist twice a year. 2. Eye doctor- Get an eye exam at least every 2 years. 3. Helmet use- Always wear a helmet when riding a bicycle, motorcycle, rollerblading or skateboarding. 4. Safe sex- If you may be exposed to sexually transmitted infections, use a condom. 5. Seat belts- Seat belts can save your live; always wear one. 6. Smoke/Carbon Monoxide detectors- These detectors need to be installed on the appropriate level of your home. Replace batteries at least once a year. 7. Skin cancer- When out in the sun please cover up and use sunscreen 15 SPF or higher. 8. Violence- If anyone is threatening or hurting you, please tell your healthcare provider.  9. Drink alcohol in moderation- Limit alcohol intake to one drink or less per day. Never drink and drive. 10. Calcium supplementation 1000 to 1200 mg daily, ideally through your diet.  Vitamin D supplementation 800 units daily.

## 2020-11-15 NOTE — Progress Notes (Signed)
HPI: Elizabeth Obrien is a pleasant 60 y.o. female, who is here today for her routine physical.  Last CPE: 02/10/19  Regular exercise 3 or more time per week: She does not have a routine but she is very active outdoors. She is also walking while she is at work.She works in the hospital, night shift. Following a healthy diet: Yes. She lives alone. She sees her children and grandchildren almost daily.  Chronic medical problems: Hypothyroidism, anxiety,depression,varicose veins,allergies,and migraine headaches among some.  Pap smear: Current. She sees her gyn regularly, next visit in 12/2020.  Immunization History  Administered Date(s) Administered   Influenza Whole 05/29/2008, 05/21/2009   Influenza,inj,Quad PF,6+ Mos 05/31/2018   Influenza-Unspecified 06/06/2014   Td 01/11/2006, 05/31/2018   Zoster Recombinat (Shingrix) 11/15/2020   Mammogram: 11/08/19 at [facility]. Colonoscopy: 03/2016, 5 years follow up recommended. DEXA: With gyn, started on Fosamax. She takes a daily multivitamin.  Hep C screening: 01/15/2017 NR.  Hypothyroidism: She is on Levothyroxine 75 mcg daily.  Lab Results  Component Value Date   TSH 1.15 02/10/2019   Migraines: 1-2 month. No new associated symptoms. Imitrex 100 mg still helps.  Anxiety and depression: Aggravated her by divorce, about 2 years ago. Her gyn increased dose of Zoloft from 50 mg to 100 mg daily, which has helped. She is also on Alprazolam 0.25 mg tid as needed. Negative for suicidal thoughts. She enjoys spending time with her grandson.  Still having right shoulder pain. Exacerbated by sleeping on her right side. Seen for this problem in 07/2019.  Review of Systems  Constitutional: Negative for appetite change, fatigue and fever.  HENT: Negative for hearing loss, mouth sores, sore throat, trouble swallowing and voice change.   Eyes: Negative for redness and visual disturbance.  Respiratory: Negative for cough, shortness  of breath and wheezing.   Cardiovascular: Negative for chest pain and leg swelling.  Gastrointestinal: Negative for abdominal pain, nausea and vomiting.       No changes in bowel habits.  Endocrine: Negative for cold intolerance, heat intolerance, polydipsia, polyphagia and polyuria.  Genitourinary: Negative for decreased urine volume, dysuria, hematuria, vaginal bleeding and vaginal discharge.  Musculoskeletal: Positive for arthralgias. Negative for gait problem and myalgias.  Skin: Negative for color change and rash.  Allergic/Immunologic: Positive for environmental allergies.  Neurological: Negative for syncope, facial asymmetry and weakness.  Hematological: Negative for adenopathy. Does not bruise/bleed easily.  Psychiatric/Behavioral: Positive for sleep disturbance. Negative for confusion. The patient is nervous/anxious.   All other systems reviewed and are negative.  Current Outpatient Medications on File Prior to Visit  Medication Sig Dispense Refill   ALPRAZolam (XANAX) 0.25 MG tablet alprazolam 0.25 mg tablet  Take 1 tablet 3 times a day by oral route as needed.     Ascorbic Acid (VITAMIN C) 1000 MG tablet Take 1,000 mg by mouth daily. Reported on 03/17/2016     aspirin-acetaminophen-caffeine (EXCEDRIN MIGRAINE) 923-300-76 MG per tablet Take by mouth every 6 (six) hours as needed for headache.     CALCIUM CARBONATE PO Take 500 mg by mouth. Reported on 03/17/2016     conjugated estrogens (PREMARIN) vaginal cream Premarin 0.625 mg/gram vaginal cream  Insert 0.5 applicatorsful every day by vaginal route.     famotidine (PEPCID) 10 MG tablet Take 10 mg by mouth daily as needed for heartburn or indigestion.     fluticasone (FLONASE) 50 MCG/ACT nasal spray Place 2 sprays into both nostrils daily. 16 g 6   MAGNESIUM PO Take  600 mg by mouth at bedtime.     sertraline (ZOLOFT) 50 MG tablet TAKE 1 TABLET (50 MG TOTAL) BY MOUTH DAILY. 90 tablet 2   No current facility-administered  medications on file prior to visit.     Past Medical History:  Diagnosis Date   Anxiety and depression    Elevated blood sugar    Hx of prediabetes   GERD (gastroesophageal reflux disease)    Hand eczema    Hypothyroidism, iatrogenic    Follow by Dr. Bubba Camp, s/p partial thyroidectomy for nodule   Migraine    hx cluster type    Past Surgical History:  Procedure Laterality Date   BREAST SURGERY  1999   augmentation - saline   COLONOSCOPY     OPEN REDUCTION INTERNAL FIXATION (ORIF) DISTAL RADIAL FRACTURE Right 11/27/2014   Procedure: OPEN REDUCTION INTERNAL FIXATION (ORIF) RIGHT  DISTAL RADIAL FRACTURE WITH ALLOGRAFT BONE GRAFTING AND REPAIR;  Surgeon: Roseanne Kaufman, MD;  Location: [facility];  Service: Orthopedics;  Laterality: Right;   right knee  2004   arthroscopic   THYROID SURGERY  2011    Allergies  Allergen Reactions   Other Hives and Rash    Topical antibiotics    Family History  Problem Relation Age of Onset   Cancer Mother 5       breast, stomach ca   Cancer Father        colon   Colon cancer Father    Colon cancer Paternal Grandmother    Cancer Other        thyroid    Social History   Socioeconomic History   Marital status: Married    Spouse name: Not on file   Number of children: 2   Years of education: Not on file   Highest education level: Not on file  Occupational History   Occupation: PACE nurse    Employer: [employer]  Tobacco Use   Smoking status: Former Smoker    Quit date: 10/27/2000    Years since quitting: 20.0   Smokeless tobacco: Never Used   Tobacco comment: smoked for 10 years, quit in 2002  Substance and Sexual Activity   Alcohol use: Yes    Alcohol/week: 1.0 standard drink    Types: 1 Cans of beer per week    Comment: 1 drink every week   Drug use: No   Sexual activity: Not on file  Other Topics Concern   Not on file  Social History Narrative   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  Wt Readings from Last 3 Encounters:  11/15/20 159 lb (72.1 kg)  02/10/19 143 lb 4 oz (65 kg)  05/31/18 148 lb 12.8 oz (67.5 kg)   Physical Exam Vitals and nursing note reviewed.  Constitutional:      General: She is not in acute distress.    Appearance: She is well-developed  and normal weight.  HENT:     Head: Normocephalic and atraumatic.     Right Ear: Hearing, tympanic membrane, ear canal and external ear normal.     Left Ear: Hearing, tympanic membrane, ear canal and external ear normal.     Mouth/Throat:     Pharynx: Uvula midline.  Eyes:     Extraocular Movements: Extraocular movements intact.     Conjunctiva/sclera: Conjunctivae normal.     Pupils: Pupils are equal, round, and reactive to light.  Neck:     Thyroid: No thyroid mass or thyroid tenderness.     Trachea: No tracheal deviation.  Cardiovascular:     Rate and Rhythm: Normal rate and regular rhythm.     Pulses:          Dorsalis pedis pulses are 2+ on the right side and 2+ on the left side.     Heart sounds: No murmur heard.     Comments: Varicose veins LE, bilateral. Pulmonary:     Effort: Pulmonary effort is normal. No respiratory distress.     Breath sounds: Normal breath sounds.  Chest:  Breasts:     Right: No supraclavicular adenopathy.     Left: No supraclavicular adenopathy.    Abdominal:     Palpations: Abdomen is soft. There is no hepatomegaly or mass.     Tenderness: There is no abdominal tenderness.  Genitourinary:    Comments:  Deferred to gyn. Musculoskeletal:     Right lower leg: No edema.     Left lower leg: No edema.     Comments: No signs of synovitis appreciated.  Lymphadenopathy:     Cervical: No cervical adenopathy.     Upper Body:     Right upper body: No supraclavicular adenopathy.     Left upper body: No supraclavicular adenopathy.  Skin:    General: Skin is warm.     Findings: No erythema or rash.  Neurological:     General: No focal deficit present.     Mental Status: She is alert and oriented to person, place, and time.     Cranial Nerves: No cranial nerve deficit.     Coordination: Coordination normal.     Gait: Gait normal.     Deep Tendon Reflexes:     Reflex Scores:      Bicep reflexes are 2+ on the right side and 2+ on the left side.      Patellar reflexes are 2+ on the right side and 2+ on the left side. Psychiatric:     Comments: Well groomed, good eye contact.   ASSESSMENT AND PLAN:  Elizabeth Obrien was here today annual physical examination.  Orders Placed This Encounter  Procedures   Varicella-zoster vaccine IM   Comprehensive metabolic panel   Lipid panel   Hemoglobin A1c   TSH   Ambulatory referral to Orthopedic Surgery   Lab Results  Component Value Date   TSH 1.97 11/15/2020   Lab Results  Component Value Date   HGBA1C 5.6 11/15/2020   Lab Results  Component Value Date   CHOL 194 11/15/2020   HDL 74.70 11/15/2020   LDLCALC 108 (H) 11/15/2020   TRIG 55.0 11/15/2020   CHOLHDL 3 11/15/2020   Lab Results  Component Value Date   CREATININE 0.77 11/15/2020   BUN 13 11/15/2020   NA 139 11/15/2020   K 4.3 11/15/2020   CL 104 11/15/2020   CO2 30 11/15/2020   Lab Results  Component Value  Date   ALT 12 11/15/2020   AST 19 11/15/2020   ALKPHOS 62 11/15/2020   BILITOT 0.7 11/15/2020    Routine general medical examination at a health care facility She understands the importance of regular physical activity and healthy diet for prevention of  chronic illness and/or complications. Preventive guidelines reviewed. Vaccination updated. Continue her female care with her gyn. Ca++ and vit D supplementation recommended. Next CPE in a year.  The 10-year ASCVD risk score Mikey Bussing DC Brooke Bonito., et al., 2013) is: 2.5%   Values used to calculate the score:     Age: 4 years     Sex: Female     Is Non-Hispanic African American: No     Diabetic: No     Tobacco smoker: No     Systolic Blood Pressure: 177 mmHg     Is BP treated: No     HDL Cholesterol: 74.7 mg/dL     Total Cholesterol: 194 mg/dL  Migraine without status migrainosus, not intractable, unspecified migraine type Stable. Continue Imitrex daily as needed.  -     SUMAtriptan (IMITREX) 100 MG tablet; TAKE 1 TABLET BY MOUTH AT ONSET MIGRAINE. MAY REPEAT ONCE IN 2 HOURS.  Hypothyroidism, unspecified type Continue Levothyroxine 75 mcg daily.  -     levothyroxine (SYNTHROID) 75 MCG tablet; Take 1 tablet (75 mcg total) by mouth daily before breakfast. Please make appt prior to running out.  Screening for lipoid disorders -     Lipid panel  Screening for endocrine, metabolic and immunity disorder -     Comprehensive metabolic panel -     Hemoglobin A1c  Chronic right shoulder pain Getting worse. Appt with ortho will be arranged.  Need for shingles vaccine -     Varicella-zoster vaccine IM  In regard to depression and anxiety, she will continue following with her gyn.  Return in 1 year (on 11/15/2021) for cpe.   Raphael Espe G. Martinique, MD  [facility]. [place] office.   Today you have you routine preventive visit. A few things to remember from today's visit:  Routine general medical examination at a health care facility  Migraine without status migrainosus, not intractable, unspecified migraine type - Plan: SUMAtriptan (IMITREX) 100 MG tablet  Hypothyroidism, unspecified type - Plan: TSH  Screening for lipoid disorders - Plan: Lipid panel  Screening for  endocrine, metabolic and immunity disorder - Plan: Comprehensive metabolic panel, Hemoglobin A1c  Chronic right shoulder pain - Plan: Ambulatory referral to Orthopedic Surgery  If you need refills please call your pharmacy. Do not use My Chart to request refills or for acute issues that need immediate attention.   Please be sure medication list is accurate. If a new problem present, please set up appointment sooner than planned today.  At least 150 minutes of moderate exercise per week, daily brisk walking for 15-30 min is a good exercise option. Healthy diet low in saturated (animal) fats and sweets and consisting of fresh fruits and vegetables, lean meats such as fish and white chicken and whole grains.  These are some of recommendations for screening depending of age and risk factors:  - Vaccines:  Tdap vaccine every 10 years.  Shingles vaccine recommended at age 46, could be given after 61 years of age but not sure about insurance coverage. Given today.  Pneumonia vaccines: Pneumovax at 58. Sometimes Pneumovax is giving earlier if history of smoking, lung disease,diabetes,kidney disease among some.  Screening for diabetes at age 16 and every 3 years.  Cervical cancer prevention:  Pap smear starts at 61 years of age and continues periodically until 61 years old in low risk women. Pap smear every 3 years between 69 and 46 years old. Pap smear every 3-5 years between women 10 and older if pap smear negative and HPV screening negative.   -Breast cancer: Mammogram: There is disagreement between experts about when to start screening in low risk asymptomatic female but recent recommendations are to start screening at 60 and not later than 61 years old , every 1-2 years and after 61 yo q 2 years. Screening is recommended until 61 years old but some women can continue screening depending of healthy issues.  Colon cancer screening: Has been recently changed to 61 yo. Insurance may not  cover until you are 61 years old. Screening is recommended until 61 years old.  Cholesterol disorder screening at age 39 and every 3 years.  Also recommended:  1. Dental visit- Brush and floss your teeth twice daily; visit your dentist twice a year. 2. Eye doctor- Get an eye exam at least every 2 years. 3. Helmet use- Always wear a helmet when riding a bicycle, motorcycle, rollerblading or skateboarding. 4. Safe sex- If you may be exposed to sexually transmitted infections, use a condom. 5. Seat belts- Seat belts can save your live; always wear one. 6. Smoke/Carbon Monoxide detectors- These detectors need to be installed on the appropriate level of your home. Replace batteries at least once a year. 7. Skin cancer- When out in the sun please cover up and use sunscreen 15 SPF or higher. 8. Violence- If anyone is threatening or hurting you, please tell your healthcare provider.  9. Drink alcohol in moderation- Limit alcohol intake to one drink or less per day. Never drink and drive. 10. Calcium supplementation 1000 to 1200 mg daily, ideally through your diet.  Vitamin D supplementation 800 units daily.

## 2020-11-16 ENCOUNTER — Other Ambulatory Visit: Payer: Self-pay | Admitting: Family Medicine

## 2020-11-16 MED ORDER — LEVOTHYROXINE SODIUM 75 MCG PO TABS: 75.0000 ug | ORAL_TABLET | Freq: Every day | ORAL | 3 refills | Status: DC

## 2020-11-21 ENCOUNTER — Other Ambulatory Visit: Payer: Self-pay | Admitting: Physician Assistant

## 2020-11-21 ENCOUNTER — Ambulatory Visit (INDEPENDENT_AMBULATORY_CARE_PROVIDER_SITE_OTHER): Payer: No Typology Code available for payment source | Admitting: Physician Assistant

## 2020-11-21 ENCOUNTER — Ambulatory Visit: Payer: Self-pay

## 2020-11-21 ENCOUNTER — Other Ambulatory Visit: Payer: Self-pay

## 2020-11-21 ENCOUNTER — Encounter: Payer: Self-pay | Admitting: Orthopedic Surgery

## 2020-11-21 DIAGNOSIS — M25512 Pain in left shoulder: Secondary | ICD-10-CM | POA: Diagnosis not present

## 2020-11-21 DIAGNOSIS — G8929 Other chronic pain: Secondary | ICD-10-CM

## 2020-11-21 MED ORDER — METHYLPREDNISOLONE ACETATE 40 MG/ML IJ SUSP
40.0000 mg | INTRAMUSCULAR | Status: AC | PRN
  Administered 2020-11-21: 40 mg via INTRA_ARTICULAR

## 2020-11-21 MED ORDER — MELOXICAM 15 MG PO TABS: 15.0000 mg | ORAL_TABLET | Freq: Every day | ORAL | 2 refills | Status: DC

## 2020-11-21 MED ORDER — LIDOCAINE HCL 1 % IJ SOLN
5.0000 mL | INTRAMUSCULAR | Status: AC | PRN
  Administered 2020-11-21: 5 mL

## 2020-11-21 NOTE — Progress Notes (Signed)
Office Visit Note   Patient: Elizabeth Obrien           Date of Birth: [birth year]           MRN: [id number] Visit Date: 11/21/2020              Requested by: Martinique, Betty G, MD [address] [place],  [place] [address] PCP: Martinique, Betty G, MD  Chief Complaint  Patient presents with  . 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  She has been in the process of renovating a historic home the last 2 years and has had increasing left shoulder pain.  She denies any specific injury but does note that she was pulling something at an auction and this may have made it worse.  She has difficulty sleeping on this side.  She denies any radicular symptoms.  Other than anti-inflammatories and ice she has not sought any other treatment  Assessment & Plan: Visit Diagnoses:  1. Chronic left shoulder pain     Plan: Findings consistent with rotator cuff tendinitis.  We talked about the natural history of this.  I would like to try an injection today and a course of Mobic.  She is in agreement with this plan.  We will follow-up in 3 weeks for reevaluation  Follow-Up Instructions: No follow-ups on file.   Ortho Exam  Patient is alert, oriented, no adenopathy, well-dressed, normal affect, normal respiratory effort. Examination no deformity of her left shoulder is appreciated no tenderness really over the clavicle she does have impingement findings.  She has pain with extension which is slightly less than the other side.  Slightly less internal rotation behind her back.  Not really any tenderness in her neck.  Slight decrease in weakness with resisted abduction.  Imaging: No results found. No images are attached to the encounter.  Labs: Lab Results  Component Value Date   HGBA1C 5.6 11/15/2020   HGBA1C 5.8 02/10/2019   HGBA1C 5.6 01/25/2018     Lab Results  Component Value Date   ALBUMIN 4.0 11/15/2020    No results found for:  MG No results found for: VD25OH  No results found for: PREALBUMIN CBC EXTENDED Latest Ref Rng & Units 11/27/2014 09/05/2012 06/03/2011  WBC 4.0 - 10.5 K/uL 6.2 6.4 -  RBC 3.87 - 5.11 MIL/uL 4.85 4.62 -  HGB 12.0 - 15.0 g/dL 14.8 14.0 13.8  HCT 36.0 - 46.0 % 43.9 40.9 -  PLT 150 - 400 K/uL 246 221 -  NEUTROABS 1.7 - 7.7 K/uL - 3.1 -  LYMPHSABS 0.7 - 4.0 K/uL - 2.5 -     There is no height or weight on file to calculate BMI.  Orders:  Orders Placed This Encounter  Procedures  . XR Shoulder Left   Meds ordered this encounter  Medications  . meloxicam (MOBIC) 15 MG tablet    Sig: Take 1 tablet (15 mg total) by mouth daily.    Dispense:  30 tablet    Refill:  2     Procedures: Large Joint Inj: L subacromial bursa on 11/21/2020 9:38 AM Indications: diagnostic evaluation and pain Details: 22 G 1.5 in needle, posterior approach  Arthrogram: No  Medications: 5 mL lidocaine 1 %; 40 mg methylPREDNISolone acetate 40 MG/ML Outcome: tolerated well, no immediate complications Procedure, treatment alternatives, risks and benefits explained, specific risks discussed. Consent was given by the patient.  Clinical Data: No additional findings.  ROS:  All other systems negative, except as noted in the HPI. Review of Systems  Objective: Vital Signs: LMP  (LMP Unknown)   Specialty Comments:  No specialty comments available.  PMFS History: Patient Active Problem List   Diagnosis Date Noted  . Atrophic vaginitis 02/10/2019  . Anxiety and depression 12/19/2014  . Hypothyroidism 12/19/2014  . GERD (gastroesophageal reflux disease) 09/20/2014  . Hand eczema 09/20/2014  . Migraine 01/13/2008   Past Medical History:  Diagnosis Date  . Anxiety and depression   . Elevated blood sugar    Hx of prediabetes  . GERD (gastroesophageal reflux disease)   . Hand eczema   . Hypothyroidism, iatrogenic    Follow by Dr. Bubba Camp, s/p partial thyroidectomy for nodule  . Migraine    hx  cluster type    Family History  Problem Relation Age of Onset  . Cancer Mother 70       breast, stomach ca  . Cancer Father        colon  . Colon cancer Father   . Colon cancer Paternal Grandmother   . Cancer Other        thyroid    Past Surgical History:  Procedure Laterality Date  . BREAST SURGERY  1999   augmentation - saline  . COLONOSCOPY    . OPEN REDUCTION INTERNAL FIXATION (ORIF) DISTAL RADIAL FRACTURE Right 11/27/2014   Procedure: OPEN REDUCTION INTERNAL FIXATION (ORIF) RIGHT  DISTAL RADIAL FRACTURE WITH ALLOGRAFT BONE GRAFTING AND REPAIR;  Surgeon: Roseanne Kaufman, MD;  Location: [facility];  Service: Orthopedics;  Laterality: Right;  . right knee  2004   arthroscopic  . THYROID SURGERY  2011   Social History   Occupational History  . Occupation: PACE Optician, dispensing: [employer]  Tobacco Use  . Smoking status: Former Smoker    Quit date: 10/27/2000    Years since quitting: 20.0  . Smokeless tobacco: Never Used  . Tobacco comment: smoked for 10 years, quit in 2002  Substance and Sexual Activity  . Alcohol use: Yes    Alcohol/week: 1.0 standard drink    Types: 1 Cans of beer per week    Comment: 1 drink every week  . Drug use: No  . Sexual activity: Not on file

## 2021-01-02 ENCOUNTER — Other Ambulatory Visit (HOSPITAL_COMMUNITY): Payer: Self-pay

## 2021-01-02 MED ORDER — ALENDRONATE SODIUM 70 MG PO TABS
ORAL_TABLET | ORAL | 4 refills | Status: DC
  Filled 2021-01-02: qty 4, 28d supply, fill #0
  Filled 2021-02-15: qty 4, 28d supply, fill #1
  Filled 2021-03-30: qty 4, 28d supply, fill #2
  Filled 2021-04-24: qty 4, 28d supply, fill #3

## 2021-01-02 MED ORDER — PREMARIN 0.625 MG/GM VA CREA
TOPICAL_CREAM | VAGINAL | 3 refills | Status: DC
  Filled 2021-01-02: qty 30, 90d supply, fill #0
  Filled 2021-05-19: qty 30, 90d supply, fill #1
  Filled 2021-12-10: qty 30, 90d supply, fill #2

## 2021-01-02 MED ORDER — ALPRAZOLAM 0.25 MG PO TABS
0.2500 mg | ORAL_TABLET | Freq: Three times a day (TID) | ORAL | 0 refills | Status: DC | PRN
  Filled 2021-01-02: qty 60, 20d supply, fill #0

## 2021-01-15 ENCOUNTER — Encounter: Payer: Self-pay | Admitting: Gastroenterology

## 2021-02-15 ENCOUNTER — Other Ambulatory Visit: Payer: Self-pay

## 2021-02-15 MED FILL — Sumatriptan Succinate Tab 100 MG: ORAL | 30 days supply | Qty: 9 | Fill #0 | Status: AC

## 2021-02-15 MED FILL — Levothyroxine Sodium Tab 75 MCG: ORAL | 90 days supply | Qty: 90 | Fill #0 | Status: AC

## 2021-02-17 ENCOUNTER — Other Ambulatory Visit (HOSPITAL_COMMUNITY): Payer: Self-pay

## 2021-02-19 ENCOUNTER — Other Ambulatory Visit (HOSPITAL_COMMUNITY): Payer: Self-pay

## 2021-02-19 ENCOUNTER — Other Ambulatory Visit: Payer: Self-pay

## 2021-02-19 MED ORDER — SERTRALINE HCL 100 MG PO TABS
ORAL_TABLET | ORAL | 4 refills | Status: DC
  Filled 2021-02-19 – 2021-02-27 (×2): qty 90, 90d supply, fill #0
  Filled 2021-09-16: qty 90, 90d supply, fill #1
  Filled 2021-11-24: qty 90, 90d supply, fill #2

## 2021-02-24 ENCOUNTER — Other Ambulatory Visit (HOSPITAL_COMMUNITY): Payer: Self-pay

## 2021-02-24 MED ORDER — SERTRALINE HCL 100 MG PO TABS
ORAL_TABLET | ORAL | 4 refills | Status: DC
  Filled 2021-02-24 – 2021-05-19 (×2): qty 90, 90d supply, fill #0

## 2021-02-27 ENCOUNTER — Other Ambulatory Visit (HOSPITAL_COMMUNITY): Payer: Self-pay

## 2021-03-29 ENCOUNTER — Encounter: Payer: Self-pay | Admitting: Gastroenterology

## 2021-03-30 MED FILL — Meloxicam Tab 15 MG: ORAL | 30 days supply | Qty: 30 | Fill #0 | Status: AC

## 2021-03-31 ENCOUNTER — Other Ambulatory Visit (HOSPITAL_COMMUNITY): Payer: Self-pay

## 2021-04-24 ENCOUNTER — Other Ambulatory Visit (HOSPITAL_COMMUNITY): Payer: Self-pay

## 2021-04-24 MED ORDER — ALENDRONATE SODIUM 70 MG PO TABS
ORAL_TABLET | ORAL | 3 refills | Status: DC
  Filled 2021-05-19: qty 12, 84d supply, fill #0
  Filled 2021-08-12: qty 12, 84d supply, fill #1
  Filled 2021-11-05: qty 12, 84d supply, fill #2

## 2021-05-19 ENCOUNTER — Other Ambulatory Visit (HOSPITAL_COMMUNITY): Payer: Self-pay

## 2021-05-19 ENCOUNTER — Other Ambulatory Visit: Payer: Self-pay

## 2021-05-19 MED FILL — Meloxicam Tab 15 MG: ORAL | 30 days supply | Qty: 30 | Fill #1 | Status: AC

## 2021-05-20 ENCOUNTER — Other Ambulatory Visit (HOSPITAL_COMMUNITY): Payer: Self-pay

## 2021-05-21 ENCOUNTER — Other Ambulatory Visit (HOSPITAL_COMMUNITY): Payer: Self-pay

## 2021-05-28 ENCOUNTER — Other Ambulatory Visit (HOSPITAL_COMMUNITY): Payer: Self-pay

## 2021-05-28 MED FILL — Levothyroxine Sodium Tab 75 MCG: ORAL | 90 days supply | Qty: 90 | Fill #1 | Status: AC

## 2021-05-29 ENCOUNTER — Other Ambulatory Visit (HOSPITAL_COMMUNITY): Payer: Self-pay

## 2021-08-12 ENCOUNTER — Other Ambulatory Visit (HOSPITAL_COMMUNITY): Payer: Self-pay

## 2021-08-20 ENCOUNTER — Other Ambulatory Visit (HOSPITAL_COMMUNITY): Payer: Self-pay

## 2021-08-20 MED FILL — Levothyroxine Sodium Tab 75 MCG: ORAL | 90 days supply | Qty: 90 | Fill #2 | Status: AC

## 2021-09-16 ENCOUNTER — Other Ambulatory Visit (HOSPITAL_COMMUNITY): Payer: Self-pay

## 2021-09-17 ENCOUNTER — Other Ambulatory Visit (HOSPITAL_COMMUNITY): Payer: Self-pay

## 2021-11-05 ENCOUNTER — Encounter: Payer: Self-pay | Admitting: Gastroenterology

## 2021-11-05 ENCOUNTER — Other Ambulatory Visit (HOSPITAL_COMMUNITY): Payer: Self-pay

## 2021-11-09 ENCOUNTER — Other Ambulatory Visit (HOSPITAL_COMMUNITY): Payer: Self-pay

## 2021-11-09 MED FILL — Sumatriptan Succinate Tab 100 MG: ORAL | 30 days supply | Qty: 9 | Fill #1 | Status: AC

## 2021-11-10 ENCOUNTER — Other Ambulatory Visit (HOSPITAL_COMMUNITY): Payer: Self-pay

## 2021-11-24 ENCOUNTER — Other Ambulatory Visit (HOSPITAL_COMMUNITY): Payer: Self-pay

## 2021-11-24 ENCOUNTER — Other Ambulatory Visit: Payer: Self-pay | Admitting: Family Medicine

## 2021-11-24 DIAGNOSIS — E039 Hypothyroidism, unspecified: Secondary | ICD-10-CM

## 2021-11-24 MED ORDER — LEVOTHYROXINE SODIUM 75 MCG PO TABS
ORAL_TABLET | ORAL | 0 refills | Status: DC
  Filled 2021-11-24: qty 30, 30d supply, fill #0

## 2021-11-27 ENCOUNTER — Other Ambulatory Visit (HOSPITAL_COMMUNITY): Payer: Self-pay

## 2021-12-01 ENCOUNTER — Encounter: Payer: Self-pay | Admitting: *Deleted

## 2021-12-10 ENCOUNTER — Other Ambulatory Visit (HOSPITAL_COMMUNITY): Payer: Self-pay

## 2021-12-16 NOTE — Progress Notes (Signed)
? ?HPI: ?Elizabeth Obrien is a 61 y.o. female, who is here today for chronic disease management. ? ?Last seen on 11/15/20. ?No new problems since her last visit. ?Hypothyroidism: Status post partial thyroidectomy due to thyroid nodule.  She used to follow with endocrinologist, Dr. Bubba Camp. ?She is on Levothyroxine 75 mcg daily. She has lost Rx and needs to refill it earlier. ? ?Negative for abnormal weight loss, palpitations, heat/cold intolerance, or tremor. ? ?Lab Results  ?Component Value Date  ? TSH 1.97 11/15/2020  ? ?She would like to have other labs done today, she is planning on retiring and change health insurance. ? ?Prediabetes: HgA1C has been as high as 6.0. ?Negative for abdominal pain, nausea,vomiting, polydipsia,polyuria, or polyphagia. ? ?Lab Results  ?Component Value Date  ? HGBA1C 5.6 11/15/2020  ? ?Lab Results  ?Component Value Date  ? CHOL 194 11/15/2020  ? HDL 74.70 11/15/2020  ? LDLCALC 108 (H) 11/15/2020  ? TRIG 55.0 11/15/2020  ? CHOLHDL 3 11/15/2020  ? ?Depression and anxiety , she follows with her gyn.  Currently she is on sertraline and alprazolam. ? ?In general she follows a healthful diet and she is active outdoors, she has a garden. ? ?Review of Systems  ?Constitutional:  Negative for activity change, appetite change and fever.  ?HENT:  Negative for mouth sores, nosebleeds, sore throat and trouble swallowing.   ?Eyes:  Negative for redness and visual disturbance.  ?Respiratory:  Negative for cough, shortness of breath and wheezing.   ?Cardiovascular:  Negative for chest pain and leg swelling.  ?Gastrointestinal:   ?     Negative for changes in bowel habits.  ?Neurological:  Negative for syncope, weakness and headaches.  ?Rest of ROS see pertinent positives and negatives in HPI. ? ?Current Outpatient Medications on File Prior to Visit  ?Medication Sig Dispense Refill  ? alendronate (FOSAMAX) 70 MG tablet Take 1 tablet by mouth once weekly 12 tablet 3  ? ALPRAZolam (XANAX) 0.25 MG  tablet alprazolam 0.25 mg tablet ? Take 1 tablet 3 times a day by oral route as needed.    ? ALPRAZolam (XANAX) 0.25 MG tablet TAKE 1 TABLET BY MOUTH THREE TIMES DAILY AS NEEDED 60 tablet 0  ? Ascorbic Acid (VITAMIN C) 1000 MG tablet Take 1,000 mg by mouth daily. Reported on 03/17/2016    ? aspirin-acetaminophen-caffeine (EXCEDRIN MIGRAINE) 941-740-81 MG per tablet Take by mouth every 6 (six) hours as needed for headache.    ? CALCIUM CARBONATE PO Take 500 mg by mouth. Reported on 03/17/2016    ? conjugated estrogens (PREMARIN) 0.625 MG/GM vaginal cream Premarin 0.625 mg/gram vaginal cream ? Insert 0.5 applicatorsful every day by vaginal route.    ? conjugated estrogens (PREMARIN) vaginal cream Insert 1/2 g vaginally twice a week 30 g 3  ? famotidine (PEPCID) 10 MG tablet Take 10 mg by mouth daily as needed for heartburn or indigestion.    ? fluticasone (FLONASE) 50 MCG/ACT nasal spray Place 2 sprays into both nostrils daily. 16 g 6  ? MAGNESIUM PO Take 600 mg by mouth at bedtime.    ? sertraline (ZOLOFT) 100 MG tablet Take 1 tablet by mouth every day. 90 tablet 4  ? sertraline (ZOLOFT) 100 MG tablet Take one tablet by mouth daily. 90 tablet 4  ? sertraline (ZOLOFT) 50 MG tablet TAKE 1 TABLET (50 MG TOTAL) BY MOUTH DAILY. 90 tablet 2  ? sertraline (ZOLOFT) 100 MG tablet TAKE 1 TABLET BY MOUTH ONCE DAILY 90 tablet 4  ?  SUMAtriptan (IMITREX) 100 MG tablet TAKE 1 TABLET BY MOUTH AT ONSET OF MIGRAINE MAY REPEAT ONCE IN 2 HOURS 9 tablet 3  ? ?No current facility-administered medications on file prior to visit.  ? ?Past Medical History:  ?Diagnosis Date  ? Anxiety and depression   ? Elevated blood sugar   ? Hx of prediabetes  ? GERD (gastroesophageal reflux disease)   ? Hand eczema   ? Hypothyroidism, iatrogenic   ? Follow by Dr. Bubba Camp, s/p partial thyroidectomy for nodule  ? Migraine   ? hx cluster type  ? ?Allergies  ?Allergen Reactions  ? Neosporin Original [Neomycin-Bacitracin Zn-Polymyx] Hives  ? Other Hives and Rash   ?  Topical antibiotics  ? ?Social History  ? ?Socioeconomic History  ? Marital status: Married  ?  Spouse name: Not on file  ? Number of children: 2  ? Years of education: Not on file  ? Highest education level: Not on file  ?Occupational History  ? Occupation: PACE nurse  ?  Employer: [employer]  ?Tobacco Use  ? Smoking status: Former  ?  Types: Cigarettes  ?  Quit date: 10/27/2000  ?  Years since quitting: 21.1  ? Smokeless tobacco: Never  ? Tobacco comments:  ?  smoked for 10 years, quit in 2002  ?Substance and Sexual Activity  ? Alcohol use: Yes  ?  Alcohol/week: 1.0 standard drink  ?  Types: 1 Cans of beer per week  ?  Comment: 1 drink every week  ? Drug use: No  ? Sexual activity: Not on file  ?Other Topics Concern  ? Not on file  ?Social History Narrative  ? 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   ? Home Situation: lives with husband  ?   ? Spiritual Beliefs: jewish  ?   ? Lifestyle: no regular cv exercise; diet is healthy  ?   ? ?Social Determinants of Health  ? ?Financial Resource Strain: Not on file  ?Food Insecurity: Not on file  ?Transportation Needs: Not on file  ?Physical Activity: Not on file  ?Stress: Not on file  ?Social Connections: Not on file  ? ?Vitals:  ? 12/17/21 0658  ?BP: 118/70  ?Pulse: 65  ?Resp: 16  ?Temp: 98 ?F (36.7 ?C)  ?SpO2: 99%  ? ?Wt Readings from Last 3 Encounters:  ?12/17/21 159 lb (72.1 kg)  ?11/15/20 159 lb (72.1 kg)  ?02/10/19 143 lb 4 oz (65 kg)  ? ?Body mass index is 22.81 kg/m?. ? ?Physical Exam ?Vitals and nursing note reviewed.  ?Constitutional:   ?   General: She is not in acute distress. ?   Appearance: She is well-developed.  ?HENT:  ?   Head: Normocephalic and atraumatic.  ?   Mouth/Throat:  ?   Mouth: Mucous membranes are moist.  ?   Pharynx: Oropharynx is clear.  ?Eyes:  ?   Conjunctiva/sclera: Conjunctivae normal.  ?Neck:  ?   Thyroid: No thyroid mass or thyromegaly.  ?Cardiovascular:  ?   Rate and Rhythm: Normal rate and regular rhythm.  ?   Pulses:     ?      Dorsalis pedis pulses are 2+ on the right side and 2+ on the left side.  ?   Heart sounds: No murmur heard. ?Pulmonary:  ?   Effort: Pulmonary effort is normal. No respiratory distress.  ?   Breath sounds: Normal breath sounds.  ?Abdominal:  ?   Palpations: Abdomen is soft. There is no  hepatomegaly or mass.  ?   Tenderness: There is no abdominal tenderness.  ?Lymphadenopathy:  ?   Cervical: No cervical adenopathy.  ?Skin: ?   General: Skin is warm.  ?   Findings: No erythema or rash.  ?Neurological:  ?   General: No focal deficit present.  ?   Mental Status: She is alert and oriented to person, place, and time.  ?   Cranial Nerves: No cranial nerve deficit.  ?   Gait: Gait normal.  ?Psychiatric:  ?   Comments: Well groomed, good eye contact.  ? ?ASSESSMENT AND PLAN: ? ?Elizabeth Obrien was seen today for follow-up. ? ?Diagnoses and all orders for this visit: ?Lab Results  ?Component Value Date  ? TSH 1.98 12/17/2021  ? ?Lab Results  ?Component Value Date  ? CREATININE 0.74 12/17/2021  ? BUN 14 12/17/2021  ? NA 140 12/17/2021  ? K 4.9 12/17/2021  ? CL 106 12/17/2021  ? CO2 30 12/17/2021  ? ?Lab Results  ?Component Value Date  ? ALT 20 12/17/2021  ? AST 30 12/17/2021  ? ALKPHOS 51 12/17/2021  ? BILITOT 0.5 12/17/2021  ? ?Lab Results  ?Component Value Date  ? CHOL 180 12/17/2021  ? HDL 68.30 12/17/2021  ? LDLCALC 100 (H) 12/17/2021  ? TRIG 59.0 12/17/2021  ? CHOLHDL 3 12/17/2021  ? ?Lab Results  ?Component Value Date  ? HGBA1C 5.9 12/17/2021  ? ?Postsurgical hypothyroidism ?Problem has been adequately controlled. ?Continue levothyroxine 75 mcg daily. ?Further recommendation will be given according to TSH result. ? ?Screening for lipoid disorders ?-     Lipid panel ? ?Screening for endocrine, metabolic and immunity disorder ?-     Hemoglobin A1c ?-     Comprehensive metabolic panel ? ?Return in about 1 year (around 12/18/2022). ? ?Cypher Paule G. Martinique, MD ? ?[place]. ?[place] office. ? ? ?

## 2021-12-17 ENCOUNTER — Other Ambulatory Visit (HOSPITAL_COMMUNITY): Payer: Self-pay

## 2021-12-17 ENCOUNTER — Encounter: Payer: Self-pay | Admitting: Family Medicine

## 2021-12-17 ENCOUNTER — Ambulatory Visit (INDEPENDENT_AMBULATORY_CARE_PROVIDER_SITE_OTHER): Payer: No Typology Code available for payment source | Admitting: Family Medicine

## 2021-12-17 VITALS — BP 118/70 | HR 65 | Temp 98.0°F | Resp 16 | Ht 70.0 in | Wt 159.0 lb

## 2021-12-17 DIAGNOSIS — E890 Postprocedural hypothyroidism: Secondary | ICD-10-CM

## 2021-12-17 DIAGNOSIS — Z13220 Encounter for screening for lipoid disorders: Secondary | ICD-10-CM | POA: Diagnosis not present

## 2021-12-17 DIAGNOSIS — Z13228 Encounter for screening for other metabolic disorders: Secondary | ICD-10-CM

## 2021-12-17 DIAGNOSIS — Z1329 Encounter for screening for other suspected endocrine disorder: Secondary | ICD-10-CM

## 2021-12-17 DIAGNOSIS — E039 Hypothyroidism, unspecified: Secondary | ICD-10-CM | POA: Diagnosis not present

## 2021-12-17 DIAGNOSIS — Z130 Encounter for screening for diseases of the blood and blood-forming organs and certain disorders involving the immune mechanism: Secondary | ICD-10-CM

## 2021-12-17 DIAGNOSIS — R7303 Prediabetes: Secondary | ICD-10-CM

## 2021-12-17 LAB — COMPREHENSIVE METABOLIC PANEL
ALT: 20 U/L (ref 0–35)
AST: 30 U/L (ref 0–37)
Albumin: 4.1 g/dL (ref 3.5–5.2)
Alkaline Phosphatase: 51 U/L (ref 39–117)
BUN: 14 mg/dL (ref 6–23)
CO2: 30 mEq/L (ref 19–32)
Calcium: 9 mg/dL (ref 8.4–10.5)
Chloride: 106 mEq/L (ref 96–112)
Creatinine, Ser: 0.74 mg/dL (ref 0.40–1.20)
GFR: 86.95 mL/min (ref >60.00)
Glucose, Bld: 90 mg/dL (ref 70–99)
Potassium: 4.9 mEq/L (ref 3.5–5.1)
Sodium: 140 mEq/L (ref 135–145)
Total Bilirubin: 0.5 mg/dL (ref 0.2–1.2)
Total Protein: 7.3 g/dL (ref 6.0–8.3)

## 2021-12-17 LAB — LIPID PANEL
Cholesterol: 180 mg/dL (ref 0–200)
HDL: 68.3 mg/dL (ref >39.00)
LDL Cholesterol: 100 mg/dL — ABNORMAL HIGH (ref 0–99)
NonHDL: 111.71
Total CHOL/HDL Ratio: 3
Triglycerides: 59 mg/dL (ref 0.0–149.0)
VLDL: 11.8 mg/dL (ref 0.0–40.0)

## 2021-12-17 LAB — TSH: TSH: 1.98 u[IU]/mL (ref 0.35–5.50)

## 2021-12-17 LAB — HEMOGLOBIN A1C: Hgb A1c MFr Bld: 5.9 % (ref 4.6–6.5)

## 2021-12-17 MED ORDER — LEVOTHYROXINE SODIUM 75 MCG PO TABS
75.0000 ug | ORAL_TABLET | Freq: Every day | ORAL | 3 refills | Status: DC
  Filled 2021-12-17: qty 90, 90d supply, fill #0
  Filled 2022-03-27: qty 90, 90d supply, fill #1
  Filled 2022-06-25: qty 90, 90d supply, fill #2
  Filled 2022-09-30: qty 90, 90d supply, fill #3

## 2021-12-17 NOTE — Assessment & Plan Note (Signed)
Problem has been adequately controlled. ?Continue levothyroxine 75 mcg daily. ?Further recommendation will be given according to TSH result. ?

## 2021-12-17 NOTE — Patient Instructions (Signed)
A few things to remember from today's visit: ? ?Hypothyroidism, unspecified type - Plan: TSH ? ?Screening for lipoid disorders - Plan: Lipid panel ? ?Screening for endocrine, metabolic and immunity disorder - Plan: Comprehensive metabolic panel, Hemoglobin A1c ? ?If you need refills please call your pharmacy. ?Do not use My Chart to request refills or for acute issues that need immediate attention. ?  ?No changes today. ?Will send prescriptions for Levothyroxine after lab results are back. ? ?Please be sure medication list is accurate. ?If a new problem present, please set up appointment sooner than planned today. ? ? ? ? ? ? ? ?

## 2021-12-18 ENCOUNTER — Other Ambulatory Visit (HOSPITAL_COMMUNITY): Payer: Self-pay

## 2022-01-02 ENCOUNTER — Ambulatory Visit (AMBULATORY_SURGERY_CENTER): Payer: No Typology Code available for payment source | Admitting: *Deleted

## 2022-01-02 ENCOUNTER — Other Ambulatory Visit (HOSPITAL_COMMUNITY): Payer: Self-pay

## 2022-01-02 VITALS — Ht 70.0 in | Wt 159.0 lb

## 2022-01-02 DIAGNOSIS — Z800 Family history of malignant neoplasm of digestive organs: Secondary | ICD-10-CM

## 2022-01-02 DIAGNOSIS — Z86010 Personal history of colonic polyps: Secondary | ICD-10-CM

## 2022-01-02 MED ORDER — NA SULFATE-K SULFATE-MG SULF 17.5-3.13-1.6 GM/177ML PO SOLN
1.0000 | ORAL | 0 refills | Status: DC
  Filled 2022-01-02: qty 354, 1d supply, fill #0

## 2022-01-02 NOTE — Progress Notes (Signed)
Patient's pre-visit was done today over the phone with the patient. Name,DOB and address verified. Patient denies any allergies to Eggs and Soy. Patient denies any problems with anesthesia/sedation. Patient is not taking any diet pills or blood thinners. No home Oxygen. Insurance confirmed with patient. ? ?Prep instructions sent to pt's MyChart-pt is aware. Patient understands to call us back with any questions or concerns. Patient is aware of our care-partner policy.  ? ?EMMI education assigned to the patient for the procedure, sent to MyChart.  ? ?The patient is COVID-19 vaccinated.   ?

## 2022-01-21 ENCOUNTER — Encounter: Payer: Self-pay | Admitting: Gastroenterology

## 2022-01-23 ENCOUNTER — Encounter: Payer: Self-pay | Admitting: Gastroenterology

## 2022-01-23 ENCOUNTER — Ambulatory Visit (AMBULATORY_SURGERY_CENTER): Payer: No Typology Code available for payment source | Admitting: Gastroenterology

## 2022-01-23 VITALS — BP 113/66 | HR 63 | Temp 96.6°F | Resp 20 | Ht 70.0 in | Wt 159.0 lb

## 2022-01-23 DIAGNOSIS — D125 Benign neoplasm of sigmoid colon: Secondary | ICD-10-CM | POA: Diagnosis not present

## 2022-01-23 DIAGNOSIS — Z86010 Personal history of colonic polyps: Secondary | ICD-10-CM

## 2022-01-23 DIAGNOSIS — Z800 Family history of malignant neoplasm of digestive organs: Secondary | ICD-10-CM | POA: Diagnosis present

## 2022-01-23 DIAGNOSIS — K635 Polyp of colon: Secondary | ICD-10-CM | POA: Diagnosis not present

## 2022-01-23 HISTORY — PX: COLONOSCOPY: SHX174

## 2022-01-23 MED ORDER — SODIUM CHLORIDE 0.9 % IV SOLN: 500.0000 mL | Freq: Once | INTRAVENOUS | Status: DC

## 2022-01-23 NOTE — Progress Notes (Signed)
Called to room to assist during endoscopic procedure.  Patient ID and intended procedure confirmed with present staff. Received instructions for my participation in the procedure from the performing physician.  

## 2022-01-23 NOTE — Op Note (Signed)
[facility] Patient Name: Elizabeth Obrien Procedure Date: 01/23/2022 9:33 AM MRN: [id number] Endoscopist: Mauri Pole , MD Age: 61 Referring MD:  Date of Birth: 08/23/1961 Gender: Female Account #: [id number] Procedure:                Colonoscopy Indications:              Screening in patient at increased risk: Colorectal                            cancer in father 62 or older, High risk colon                            cancer surveillance: Personal history of colonic                            polyps Medicines:                Monitored Anesthesia Care Procedure:                Pre-Anesthesia Assessment:                           - Prior to the procedure, a History and Physical                            was performed, and patient medications and                            allergies were reviewed. The patient's tolerance of                            previous anesthesia was also reviewed. The risks                            and benefits of the procedure and the sedation                            options and risks were discussed with the patient.                            All questions were answered, and informed consent                            was obtained. Prior Anticoagulants: The patient has                            taken no previous anticoagulant or antiplatelet                            agents. ASA Grade Assessment: II - A patient with                            mild systemic disease. After reviewing the risks  and benefits, the patient was deemed in                            satisfactory condition to undergo the procedure.                           After obtaining informed consent, the colonoscope                            was passed under direct vision. Throughout the                            procedure, the patient's blood pressure, pulse, and                            oxygen saturations were monitored continuously. The                             Olympus PCF-H190DL ([id number]) Colonoscope was                            introduced through the anus and advanced to the the                            cecum, identified by appendiceal orifice and                            ileocecal valve. The colonoscopy was performed                            without difficulty. The patient tolerated the                            procedure well. The quality of the bowel                            preparation was good. The terminal ileum, ileocecal                            valve, appendiceal orifice, and rectum and the                            ileocecal valve, appendiceal orifice, and rectum                            were photographed. Scope In: 9:41:08 AM Scope Out: 9:58:39 AM Scope Withdrawal Time: 0 hours 13 minutes 19 seconds  Total Procedure Duration: 0 hours 17 minutes 31 seconds  Findings:                 The perianal and digital rectal examinations were                            normal.  A 3 mm polyp was found in the sigmoid colon. The                            polyp was sessile. The polyp was removed with a                            cold snare. Resection and retrieval were complete.                           A few small-mouthed diverticula were found in the                            sigmoid colon and descending colon.                           Non-bleeding external and internal hemorrhoids were                            found during retroflexion. The hemorrhoids were                            small. Complications:            No immediate complications. Estimated Blood Loss:     Estimated blood loss was minimal. Impression:               - One 3 mm polyp in the sigmoid colon, removed with                            a cold snare. Resected and retrieved.                           - Diverticulosis in the sigmoid colon and in the                            descending colon.                            - Non-bleeding external and internal hemorrhoids. Recommendation:           - Patient has a contact number available for                            emergencies. The signs and symptoms of potential                            delayed complications were discussed with the                            patient. Return to normal activities tomorrow.                            Written discharge instructions were provided to the  patient.                           - Resume previous diet.                           - Continue present medications.                           - Await pathology results.                           - Repeat colonoscopy in 5 years for surveillance                            based on pathology results. Mauri Pole, MD 01/23/2022 10:05:17 AM This report has been signed electronically.

## 2022-01-23 NOTE — Progress Notes (Signed)
Report to pacu rn; vss. ?

## 2022-01-23 NOTE — Patient Instructions (Signed)
Discharge instructions given. Handouts on polyps,diverticulosis and hemorrhoids. Resume previous medications. YOU HAD AN ENDOSCOPIC PROCEDURE TODAY AT [facility]:   Refer to the procedure report that was given to you for any specific questions about what was found during the examination.  If the procedure report does not answer your questions, please call your gastroenterologist to clarify.  If you requested that your care partner not be given the details of your procedure findings, then the procedure report has been included in a sealed envelope for you to review at your convenience later.  YOU SHOULD EXPECT: Some feelings of bloating in the abdomen. Passage of more gas than usual.  Walking can help get rid of the air that was put into your GI tract during the procedure and reduce the bloating. If you had a lower endoscopy (such as a colonoscopy or flexible sigmoidoscopy) you may notice spotting of blood in your stool or on the toilet paper. If you underwent a bowel prep for your procedure, you may not have a normal bowel movement for a few days.  Please Note:  You might notice some irritation and congestion in your nose or some drainage.  This is from the oxygen used during your procedure.  There is no need for concern and it should clear up in a day or so.  SYMPTOMS TO REPORT IMMEDIATELY:  Following lower endoscopy (colonoscopy or flexible sigmoidoscopy):  Excessive amounts of blood in the stool  Significant tenderness or worsening of abdominal pains  Swelling of the abdomen that is new, acute  Fever of 100F or higher   For urgent or emergent issues, a gastroenterologist can be reached at any hour by calling [phone]. Do not use MyChart messaging for urgent concerns.    DIET:  We do recommend a small meal at first, but then you may proceed to your regular diet.  Drink plenty of fluids but you should avoid alcoholic beverages for 24 hours.  ACTIVITY:  You should  plan to take it easy for the rest of today and you should NOT DRIVE or use heavy machinery until tomorrow (because of the sedation medicines used during the test).    FOLLOW UP: Our staff will call the number listed on your records 48-72 hours following your procedure to check on you and address any questions or concerns that you may have regarding the information given to you following your procedure. If we do not reach you, we will leave a message.  We will attempt to reach you two times.  During this call, we will ask if you have developed any symptoms of COVID 19. If you develop any symptoms (ie: fever, flu-like symptoms, shortness of breath, cough etc.) before then, please call [phone].  If you test positive for Covid 19 in the 2 weeks post procedure, please call and report this information to [place].    If any biopsies were taken you will be contacted by phone or by letter within the next 1-3 weeks.  Please call us at [phone] if you have not heard about the biopsies in 3 weeks.    SIGNATURES/CONFIDENTIALITY: You and/or your care partner have signed paperwork which will be entered into your electronic medical record.  These signatures attest to the fact that that the information above on your After Visit Summary has been reviewed and is understood.  Full responsibility of the confidentiality of this discharge information lies with you and/or your care-partner.

## 2022-01-23 NOTE — Progress Notes (Signed)
[facility] History and Physical   Primary Care Physician:  Martinique, Betty G, MD   Reason for Procedure:  Family history of colon cancer and personal h/o colon polyps  Plan:    Surveillance colonoscopy with possible interventions as needed     HPI: Elizabeth Obrien is a very pleasant 61 y.o. female here for surveillance colonoscopy. Denies any nausea, vomiting, abdominal pain, melena or bright red blood per rectum  The risks and benefits as well as alternatives of endoscopic procedure(s) have been discussed and reviewed. All questions answered. The patient agrees to proceed.    Past Medical History:  Diagnosis Date   Anxiety and depression    Elevated blood sugar    Hx of prediabetes   GERD (gastroesophageal reflux disease)    Hand eczema    Hypothyroidism, iatrogenic    Follow by Dr. Bubba Camp, s/p partial thyroidectomy for nodule   Migraine    hx cluster type    Past Surgical History:  Procedure Laterality Date   BREAST SURGERY  09/07/1997   augmentation - saline   COLONOSCOPY  03/31/2016   Dr.Dickey Caamano   COLONOSCOPY  01/23/2022   OPEN REDUCTION INTERNAL FIXATION (ORIF) DISTAL RADIAL FRACTURE Right 11/27/2014   Procedure: OPEN REDUCTION INTERNAL FIXATION (ORIF) RIGHT  DISTAL RADIAL FRACTURE WITH ALLOGRAFT BONE GRAFTING AND REPAIR;  Surgeon: Roseanne Kaufman, MD;  Location: [facility];  Service: Orthopedics;  Laterality: Right;   POLYPECTOMY     right knee  09/07/2002   arthroscopic   THYROID SURGERY  09/07/2009    Prior to Admission medications   Medication Sig Start Date End Date Taking? Authorizing Provider  alendronate (FOSAMAX) 70 MG tablet Take 1 tablet by mouth once weekly 01/02/21  Yes   ALPRAZolam (XANAX) 0.25 MG tablet TAKE 1 TABLET BY MOUTH THREE TIMES DAILY AS NEEDED 01/02/21  Yes   aspirin-acetaminophen-caffeine (EXCEDRIN MIGRAINE) 397-673-41 MG per tablet Take by mouth every 6 (six) hours as needed for headache.   Yes [provider]   conjugated estrogens (PREMARIN) vaginal cream Insert 1/2 g vaginally twice a week 01/02/21  Yes   levothyroxine (SYNTHROID) 75 MCG tablet Take 1 tablet by mouth daily before breakfast. 12/17/21 12/17/22 Yes Martinique, Betty G, MD  sertraline (ZOLOFT) 100 MG tablet Take 1 tablet by mouth every day. 02/19/21  Yes   SUMAtriptan (IMITREX) 100 MG tablet TAKE 1 TABLET BY MOUTH AT ONSET OF MIGRAINE MAY REPEAT ONCE IN 2 HOURS 11/15/20 12/07/23 Yes Martinique, Betty G, MD  Ascorbic Acid (VITAMIN C) 1000 MG tablet Take 1,000 mg by mouth daily. Reported on 03/17/2016    [provider]    Current Outpatient Medications  Medication Sig Dispense Refill   alendronate (FOSAMAX) 70 MG tablet Take 1 tablet by mouth once weekly 12 tablet 3   ALPRAZolam (XANAX) 0.25 MG tablet TAKE 1 TABLET BY MOUTH THREE TIMES DAILY AS NEEDED 60 tablet 0   aspirin-acetaminophen-caffeine (EXCEDRIN MIGRAINE) 250-250-65 MG per tablet Take by mouth every 6 (six) hours as needed for headache.     conjugated estrogens (PREMARIN) vaginal cream Insert 1/2 g vaginally twice a week 30 g 3   levothyroxine (SYNTHROID) 75 MCG tablet Take 1 tablet by mouth daily before breakfast. 90 tablet 3   sertraline (ZOLOFT) 100 MG tablet Take 1 tablet by mouth every day. 90 tablet 4   SUMAtriptan (IMITREX) 100 MG tablet TAKE 1 TABLET BY MOUTH AT ONSET OF MIGRAINE MAY REPEAT ONCE IN 2 HOURS 9 tablet 3   Ascorbic  Acid (VITAMIN C) 1000 MG tablet Take 1,000 mg by mouth daily. Reported on 03/17/2016     Current Facility-Administered Medications  Medication Dose Route Frequency Provider Last Rate Last Admin   0.9 %  sodium chloride infusion  500 mL Intravenous Once Mauri Pole, MD        Allergies as of 01/23/2022 - Review Complete 01/23/2022  Allergen Reaction Noted   Neosporin original [neomycin-bacitracin zn-polymyx] Hives 12/01/2021   Other Hives and Rash 07/25/2014    Family History  Problem Relation Age of Onset   Cancer Mother 30        breast, stomach ca   Colon polyps Father    Colon cancer Father 50   Colon polyps Paternal Aunt    Colon cancer Paternal Aunt    Colon polyps Paternal Grandmother    Colon cancer Paternal Grandmother    Cancer Other        thyroid   Esophageal cancer Neg Hx    Stomach cancer Neg Hx    Rectal cancer Neg Hx     Social History   Socioeconomic History   Marital status: Married    Spouse name: Not on file   Number of children: 2   Years of education: Not on file   Highest education level: Not on file  Occupational History   Occupation: PACE nurse    Employer: [employer]  Tobacco Use   Smoking status: Former    Types: Cigarettes    Quit date: 10/27/2000    Years since quitting: 21.2   Smokeless tobacco: Never   Tobacco comments:    smoked for 10 years, quit in 2002  Vaping Use   Vaping Use: Never used  Substance and Sexual Activity   Alcohol use: Never   Drug use: Never   Sexual activity: Yes    Birth control/protection: Post-menopausal  Other Topics Concern   Not on file  Social History Narrative   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  Physical Exam: Vital signs in last 24 hours: BP (!) 116/56   Pulse (!) 54   Temp (!) 96.6 F (35.9 C) (Temporal)   Ht '5\' 10"'$  (1.778 m)   Wt 159 lb (72.1 kg)   LMP  (LMP Unknown)   SpO2 100%   BMI 22.81 kg/m  General:   Alert, NAD Lungs:  Clear .   Heart:  Regular rate and rhythm Abdomen:  Soft, nontender and  nondistended. Neuro/Psych:  Alert and cooperative. Normal mood and affect. A and O x 3  Reviewed labs, radiology imaging, old records and pertinent past GI work up  Patient is appropriate for planned procedure(s) and anesthesia in an ambulatory setting   K. Denzil Magnuson , MD [phone]

## 2022-01-23 NOTE — Progress Notes (Signed)
Pt's states no medical or surgical changes since previsit or office visit. 

## 2022-01-26 ENCOUNTER — Telehealth: Payer: Self-pay | Admitting: *Deleted

## 2022-01-26 NOTE — Telephone Encounter (Signed)
Left message on f/u call 

## 2022-01-26 NOTE — Telephone Encounter (Signed)
  Follow up Call-     01/23/2022    8:40 AM  Call back number  Post procedure Call Back phone  # [phone]  Permission to leave phone message Yes     Patient questions:  Do you have a fever, pain , or abdominal swelling? No. Pain Score  0 *  Have you tolerated food without any problems? Yes.    Have you been able to return to your normal activities? Yes.    Do you have any questions about your discharge instructions: Diet   No. Medications  No. Follow up visit  no  Do you have questions or concerns about your Care? No.  Actions: * If pain score is 4 or above: No action needed, pain <4.

## 2022-01-27 ENCOUNTER — Other Ambulatory Visit: Payer: Self-pay | Admitting: Family Medicine

## 2022-01-27 ENCOUNTER — Other Ambulatory Visit (HOSPITAL_COMMUNITY): Payer: Self-pay

## 2022-01-27 DIAGNOSIS — G43909 Migraine, unspecified, not intractable, without status migrainosus: Secondary | ICD-10-CM

## 2022-01-27 MED ORDER — SUMATRIPTAN SUCCINATE 100 MG PO TABS
ORAL_TABLET | ORAL | 3 refills | Status: DC
  Filled 2022-01-27: qty 9, 30d supply, fill #0
  Filled 2022-02-23: qty 9, 30d supply, fill #1
  Filled 2022-03-27: qty 9, 30d supply, fill #2

## 2022-01-28 ENCOUNTER — Other Ambulatory Visit (HOSPITAL_COMMUNITY): Payer: Self-pay

## 2022-01-28 ENCOUNTER — Encounter: Payer: Self-pay | Admitting: Gastroenterology

## 2022-01-28 MED ORDER — ALENDRONATE SODIUM 70 MG PO TABS
ORAL_TABLET | ORAL | 0 refills | Status: DC
  Filled 2022-01-28: qty 12, 84d supply, fill #0

## 2022-02-05 ENCOUNTER — Other Ambulatory Visit (HOSPITAL_COMMUNITY): Payer: Self-pay

## 2022-02-05 DIAGNOSIS — R87610 Atypical squamous cells of undetermined significance on cytologic smear of cervix (ASC-US): Secondary | ICD-10-CM | POA: Insufficient documentation

## 2022-02-05 LAB — HM PAP SMEAR: HM Pap smear: NORMAL

## 2022-02-05 LAB — HM MAMMOGRAPHY

## 2022-02-05 LAB — RESULTS CONSOLE HPV: CHL HPV: NEGATIVE

## 2022-02-05 MED ORDER — ALPRAZOLAM 0.25 MG PO TABS
0.2500 mg | ORAL_TABLET | Freq: Three times a day (TID) | ORAL | 0 refills | Status: DC | PRN
  Filled 2022-02-05: qty 30, 10d supply, fill #0

## 2022-02-05 MED ORDER — PREMARIN 0.625 MG/GM VA CREA
TOPICAL_CREAM | VAGINAL | 3 refills | Status: AC
  Filled 2022-02-05: qty 30, 90d supply, fill #0

## 2022-02-05 MED ORDER — ALENDRONATE SODIUM 70 MG PO TABS
ORAL_TABLET | ORAL | 0 refills | Status: DC
  Filled 2022-02-05 – 2022-04-28 (×2): qty 12, 84d supply, fill #0

## 2022-02-23 ENCOUNTER — Other Ambulatory Visit (HOSPITAL_COMMUNITY): Payer: Self-pay

## 2022-03-27 ENCOUNTER — Other Ambulatory Visit (HOSPITAL_COMMUNITY): Payer: Self-pay

## 2022-03-27 MED ORDER — SERTRALINE HCL 100 MG PO TABS
100.0000 mg | ORAL_TABLET | Freq: Every day | ORAL | 4 refills | Status: DC
  Filled 2022-03-27: qty 90, 90d supply, fill #0
  Filled 2022-06-25: qty 90, 90d supply, fill #1
  Filled 2022-09-30: qty 90, 90d supply, fill #2
  Filled 2022-12-30: qty 90, 90d supply, fill #3

## 2022-03-28 ENCOUNTER — Other Ambulatory Visit (HOSPITAL_COMMUNITY): Payer: Self-pay

## 2022-03-31 ENCOUNTER — Other Ambulatory Visit (HOSPITAL_COMMUNITY): Payer: Self-pay

## 2022-04-01 ENCOUNTER — Other Ambulatory Visit (HOSPITAL_COMMUNITY): Payer: Self-pay

## 2022-04-15 ENCOUNTER — Other Ambulatory Visit (HOSPITAL_COMMUNITY): Payer: Self-pay

## 2022-04-28 ENCOUNTER — Other Ambulatory Visit (HOSPITAL_COMMUNITY): Payer: Self-pay

## 2022-05-13 ENCOUNTER — Telehealth: Payer: Self-pay

## 2022-05-13 NOTE — Telephone Encounter (Signed)
Error

## 2022-05-13 NOTE — Telephone Encounter (Signed)
-----   Message from Rodrigo Ran, [place] sent at 04/01/2022  8:44 AM EDT ----- TSh 6-8 weeks

## 2022-06-25 ENCOUNTER — Other Ambulatory Visit (HOSPITAL_COMMUNITY): Payer: Self-pay

## 2022-06-27 ENCOUNTER — Other Ambulatory Visit (HOSPITAL_COMMUNITY): Payer: Self-pay

## 2022-07-01 ENCOUNTER — Encounter (HOSPITAL_COMMUNITY): Payer: Self-pay | Admitting: Pharmacist

## 2022-07-01 ENCOUNTER — Other Ambulatory Visit (HOSPITAL_COMMUNITY): Payer: Self-pay

## 2022-07-06 ENCOUNTER — Other Ambulatory Visit (HOSPITAL_COMMUNITY): Payer: Self-pay

## 2022-08-11 ENCOUNTER — Other Ambulatory Visit (HOSPITAL_COMMUNITY): Payer: Self-pay

## 2022-08-14 ENCOUNTER — Other Ambulatory Visit (HOSPITAL_COMMUNITY): Payer: Self-pay

## 2022-08-14 MED ORDER — ALENDRONATE SODIUM 70 MG PO TABS
70.0000 mg | ORAL_TABLET | ORAL | 3 refills | Status: DC
  Filled 2022-08-14: qty 12, 84d supply, fill #0
  Filled 2022-09-30 – 2022-11-02 (×2): qty 12, 84d supply, fill #1

## 2022-09-09 DIAGNOSIS — Z719 Counseling, unspecified: Secondary | ICD-10-CM | POA: Insufficient documentation

## 2022-09-14 ENCOUNTER — Other Ambulatory Visit (HOSPITAL_COMMUNITY): Payer: Self-pay

## 2022-09-30 ENCOUNTER — Other Ambulatory Visit: Payer: Self-pay | Admitting: Family Medicine

## 2022-09-30 ENCOUNTER — Other Ambulatory Visit: Payer: Self-pay

## 2022-09-30 DIAGNOSIS — E890 Postprocedural hypothyroidism: Secondary | ICD-10-CM

## 2022-09-30 MED ORDER — LEVOTHYROXINE SODIUM 75 MCG PO TABS
75.0000 ug | ORAL_TABLET | Freq: Every day | ORAL | 0 refills | Status: DC
  Filled 2022-09-30: qty 90, 90d supply, fill #0

## 2022-11-02 ENCOUNTER — Other Ambulatory Visit (HOSPITAL_COMMUNITY): Payer: Self-pay

## 2022-12-30 ENCOUNTER — Other Ambulatory Visit (HOSPITAL_COMMUNITY): Payer: Self-pay

## 2022-12-30 ENCOUNTER — Other Ambulatory Visit: Payer: Self-pay

## 2022-12-30 ENCOUNTER — Other Ambulatory Visit: Payer: Self-pay | Admitting: Family Medicine

## 2022-12-30 DIAGNOSIS — E890 Postprocedural hypothyroidism: Secondary | ICD-10-CM

## 2022-12-30 MED ORDER — LEVOTHYROXINE SODIUM 75 MCG PO TABS
75.0000 ug | ORAL_TABLET | Freq: Every day | ORAL | 0 refills | Status: DC
  Filled 2022-12-30: qty 30, 30d supply, fill #0

## 2023-01-11 ENCOUNTER — Telehealth: Payer: Self-pay | Admitting: Family Medicine

## 2023-01-11 NOTE — Telephone Encounter (Signed)
Fine with me. Thanks, BJ 

## 2023-01-11 NOTE — Telephone Encounter (Signed)
Pt is wanting to transfer her care from Dr. Sw[place]il[place] to Dr Janee Morn. She has moved and [facility] is much closer to her home. Let me know if this is OK.

## 2023-01-28 ENCOUNTER — Encounter: Payer: 59 | Admitting: Family Medicine

## 2023-02-15 ENCOUNTER — Ambulatory Visit (INDEPENDENT_AMBULATORY_CARE_PROVIDER_SITE_OTHER): Payer: 59 | Admitting: Family Medicine

## 2023-02-15 ENCOUNTER — Encounter: Payer: Self-pay | Admitting: Family Medicine

## 2023-02-15 ENCOUNTER — Other Ambulatory Visit: Payer: Self-pay

## 2023-02-15 VITALS — BP 118/78 | HR 70 | Temp 97.8°F | Ht 70.0 in | Wt 161.4 lb

## 2023-02-15 DIAGNOSIS — Z13220 Encounter for screening for lipoid disorders: Secondary | ICD-10-CM

## 2023-02-15 DIAGNOSIS — M810 Age-related osteoporosis without current pathological fracture: Secondary | ICD-10-CM | POA: Diagnosis not present

## 2023-02-15 DIAGNOSIS — I8393 Asymptomatic varicose veins of bilateral lower extremities: Secondary | ICD-10-CM

## 2023-02-15 DIAGNOSIS — F419 Anxiety disorder, unspecified: Secondary | ICD-10-CM

## 2023-02-15 DIAGNOSIS — E890 Postprocedural hypothyroidism: Secondary | ICD-10-CM

## 2023-02-15 DIAGNOSIS — G43009 Migraine without aura, not intractable, without status migrainosus: Secondary | ICD-10-CM | POA: Diagnosis not present

## 2023-02-15 DIAGNOSIS — F32A Depression, unspecified: Secondary | ICD-10-CM | POA: Diagnosis not present

## 2023-02-15 DIAGNOSIS — Z130 Encounter for screening for diseases of the blood and blood-forming organs and certain disorders involving the immune mechanism: Secondary | ICD-10-CM

## 2023-02-15 MED ORDER — LEVOTHYROXINE SODIUM 75 MCG PO TABS
75.0000 ug | ORAL_TABLET | Freq: Every day | ORAL | 0 refills | Status: DC
Start: 2023-02-15 — End: 2023-05-05
  Filled 2023-02-15: qty 90, 90d supply, fill #0

## 2023-02-15 MED ORDER — ALENDRONATE SODIUM 70 MG PO TABS
70.0000 mg | ORAL_TABLET | ORAL | 3 refills | Status: DC
Start: 2023-02-15 — End: 2023-10-21
  Filled 2023-02-15: qty 12, 84d supply, fill #0
  Filled 2023-05-20: qty 12, 84d supply, fill #1
  Filled 2023-08-11: qty 12, 84d supply, fill #2

## 2023-02-15 NOTE — Patient Instructions (Addendum)
Patient Instructions:  Refill Medications: Continue taking alendronate 70 mg once a week and levothyroxine 75 mcg daily. Your prescription refills have been submitted to [facility] on [address]. Lab Work: Schedule an appointment for lab work a few days before your physical. Make sure to fast (no food) from the evening before until the blood work is done in the morning. Physical Exam: Schedule a physical exam in about a month. This will allow for comprehensive evaluation and discussion of your lab results. Daily Activity: Continue your gardening activities as it is good physical exercise, but consider incorporating weight-bearing exercises such as weightlifting to help with bone health.

## 2023-02-15 NOTE — Progress Notes (Signed)
Assessment/Plan:   Problem List Items Addressed This Visit   None   There are no discontinued medications.  No follow-ups on file.    Subjective:   Encounter date: 02/15/2023  Elizabeth Obrien is a 62 y.o. female who has Migraine; GERD (gastroesophageal reflux disease); Hand eczema; Anxiety and depression; Postsurgical hypothyroidism; and Atrophic vaginitis on their problem list..   She  has a past medical history of Anxiety and depression, Elevated blood sugar, GERD (gastroesophageal reflux disease), Hand eczema, Hypothyroidism, iatrogenic, and Migraine..   She presents with chief complaint of Establish Care (TOC rx refill levothyroxine and fosamax. Blood work for tsh. ) .   HPI:   ROS  Past Surgical History:  Procedure Laterality Date  . BREAST SURGERY  09/07/1997   augmentation - saline  . COLONOSCOPY  03/31/2016   Dr.Nandigam  . COLONOSCOPY  01/23/2022  . OPEN REDUCTION INTERNAL FIXATION (ORIF) DISTAL RADIAL FRACTURE Right 11/27/2014   Procedure: OPEN REDUCTION INTERNAL FIXATION (ORIF) RIGHT  DISTAL RADIAL FRACTURE WITH ALLOGRAFT BONE GRAFTING AND REPAIR;  Surgeon: Dominica Severin, MD;  Location: [facility] OR;  Service: Orthopedics;  Laterality: Right;  . POLYPECTOMY    . right knee  09/07/2002   arthroscopic  . THYROID SURGERY  09/07/2009    Outpatient Medications Prior to Visit  Medication Sig Dispense Refill  . alendronate (FOSAMAX) 70 MG tablet Take 1 tablet by mouth once weekly 12 tablet 3  . alendronate (FOSAMAX) 70 MG tablet TAKE 1 TABLET BY MOUTH ONCE EVERY WEEK 12 tablet 0  . alendronate (FOSAMAX) 70 MG tablet Take 1 tablet (70 mg total) by mouth once a week. 12 tablet 3  . Ascorbic Acid (VITAMIN C) 1000 MG tablet Take 1,000 mg by mouth daily. Reported on 03/17/2016    . aspirin-acetaminophen-caffeine (EXCEDRIN MIGRAINE) 250-250-65 MG per tablet Take by mouth every 6 (six) hours as needed for headache.    . conjugated estrogens (PREMARIN) vaginal cream Insert  1/2 gram vaginally twice a week 30 g 3  . levothyroxine (SYNTHROID) 75 MCG tablet Take 1 tablet (75 mcg total) by mouth daily before breakfast. Please call [phone] to schedule an appointment for further refills. 30 tablet 0  . sertraline (ZOLOFT) 100 MG tablet Take 1 tablet by mouth every day. 90 tablet 4  . sertraline (ZOLOFT) 100 MG tablet Take 1 tablet (100 mg total) by mouth daily. 90 tablet 4  . ALPRAZolam (XANAX) 0.25 MG tablet TAKE 1 TABLET BY MOUTH THREE TIMES DAILY AS NEEDED 60 tablet 0  . ALPRAZolam (XANAX) 0.25 MG tablet TAKE 1 TABLET BY MOUTH THREE TIMES DAILY AS NEEDED 30 tablet 0  . SUMAtriptan (IMITREX) 100 MG tablet TAKE 1 TABLET BY MOUTH AT ONSET OF MIGRAINE MAY REPEAT ONCE IN 2 HOURS 9 tablet 3   No facility-administered medications prior to visit.    Family History  Problem Relation Age of Onset  . Cancer Mother 24       breast, stomach ca  . Colon polyps Father   . Colon cancer Father 35  . Colon polyps Paternal Aunt   . Colon cancer Paternal Aunt   . Colon polyps Paternal Grandmother   . Colon cancer Paternal Grandmother   . Cancer Other        thyroid  . Esophageal cancer Neg Hx   . Stomach cancer Neg Hx   . Rectal cancer Neg Hx     Social History   Socioeconomic History  . Marital status: Married  Spouse name: Not on file  . Number of children: 2  . Years of education: Not on file  . Highest education level: Not on file  Occupational History  . 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 Smoking status: Former    Types: Cigarettes    Quit date: 10/27/2000    Years since quitting: 22.3    Passive exposure: Never  . Smokeless tobacco: Never  . Tobacco comments:    smoked for 10 years, quit in 2002  Vaping Use  . Vaping Use: Never used  Substance and Sexual Activity  . Alcohol use: Never  . Drug use: Never  . Sexual activity: Yes    Birth control/protection: Post-menopausal  Other Topics Concern  . 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 [employer] Situation: lives with husband      Spiritual Beliefs: jewish      Lifestyle: no regular cv exercise; diet is healthy      Social Determinants of Corporate investment banker Strain: Not on file  Food Insecurity: Not on file  Transportation Needs: Not on file  Physical Activity: Not on file  Stress: Not on file  Social Connections: Not on file  Intimate Partner Violence: Not on file                                                                                                  Objective:  Physical Exam: BP 118/78 (BP Location: Left Arm, Patient Position: Sitting, Cuff Size: Large)   Pulse 70   Temp 97.8 F (36.6 C) (Temporal)   Ht 5\' 10"  (1.778 m)   Wt 161 lb 6.4 oz (73.2 kg)   LMP  (LMP Unknown)   SpO2 98%   BMI 23.16 kg/m     Physical Exam  No results found.  No results found for this or any previous visit (from the past 2160 hour(s)).      Garner Nash, MD, MS

## 2023-02-17 DIAGNOSIS — M810 Age-related osteoporosis without current pathological fracture: Secondary | ICD-10-CM | POA: Insufficient documentation

## 2023-02-17 NOTE — Assessment & Plan Note (Signed)
Refill Levothyroxine 75 mcg daily Check Thyroid panel

## 2023-02-17 NOTE — Assessment & Plan Note (Signed)
Chronic, but stable on Imitrex. Continue current mgmt

## 2023-02-17 NOTE — Assessment & Plan Note (Signed)
Refill Alendronate (Fosamax) 70 mg once weekly Continuing current medication and regular bone health monitoring Most recent bone scan one year ago showed osteoporosis but no deterioration. Encouraged to include weight-bearing exercises and monitor dietary calcium and vitamin D intake.

## 2023-02-17 NOTE — Assessment & Plan Note (Signed)
Patient continues on Zoloft for anxiety management. Plan discussed for potential gradual weaning if desired, but currently stable.

## 2023-03-08 ENCOUNTER — Other Ambulatory Visit: Payer: 59

## 2023-03-10 ENCOUNTER — Other Ambulatory Visit (INDEPENDENT_AMBULATORY_CARE_PROVIDER_SITE_OTHER): Payer: 59

## 2023-03-10 DIAGNOSIS — M810 Age-related osteoporosis without current pathological fracture: Secondary | ICD-10-CM | POA: Diagnosis not present

## 2023-03-10 DIAGNOSIS — F419 Anxiety disorder, unspecified: Secondary | ICD-10-CM | POA: Diagnosis not present

## 2023-03-10 DIAGNOSIS — F32A Depression, unspecified: Secondary | ICD-10-CM | POA: Diagnosis not present

## 2023-03-10 DIAGNOSIS — E890 Postprocedural hypothyroidism: Secondary | ICD-10-CM

## 2023-03-10 LAB — LIPID PANEL
Cholesterol: 222 mg/dL — ABNORMAL HIGH (ref 0–200)
HDL: 66.4 mg/dL (ref >39.00)
LDL Cholesterol: 141 mg/dL — ABNORMAL HIGH (ref 0–99)
NonHDL: 155.89
Total CHOL/HDL Ratio: 3
Triglycerides: 72 mg/dL (ref 0.0–149.0)
VLDL: 14.4 mg/dL (ref 0.0–40.0)

## 2023-03-10 LAB — COMPREHENSIVE METABOLIC PANEL
ALT: 11 U/L (ref 0–35)
AST: 19 U/L (ref 0–37)
Albumin: 4.2 g/dL (ref 3.5–5.2)
Alkaline Phosphatase: 49 U/L (ref 39–117)
BUN: 12 mg/dL (ref 6–23)
CO2: 28 mEq/L (ref 19–32)
Calcium: 9.5 mg/dL (ref 8.4–10.5)
Chloride: 105 mEq/L (ref 96–112)
Creatinine, Ser: 0.82 mg/dL (ref 0.40–1.20)
GFR: 76.22 mL/min (ref >60.00)
Glucose, Bld: 99 mg/dL (ref 70–99)
Potassium: 3.9 mEq/L (ref 3.5–5.1)
Sodium: 140 mEq/L (ref 135–145)
Total Bilirubin: 0.8 mg/dL (ref 0.2–1.2)
Total Protein: 7.4 g/dL (ref 6.0–8.3)

## 2023-03-10 LAB — CBC WITH DIFFERENTIAL/PLATELET
Basophils Absolute: 0 10*3/uL (ref 0.0–0.1)
Basophils Relative: 0.6 % (ref 0.0–3.0)
Eosinophils Absolute: 0.2 10*3/uL (ref 0.0–0.7)
Eosinophils Relative: 3.5 % (ref 0.0–5.0)
HCT: 44.5 % (ref 36.0–46.0)
Hemoglobin: 14.5 g/dL (ref 12.0–15.0)
Lymphocytes Relative: 31.1 % (ref 12.0–46.0)
Lymphs Abs: 2 10*3/uL (ref 0.7–4.0)
MCHC: 32.7 g/dL (ref 30.0–36.0)
MCV: 90.1 fl (ref 78.0–100.0)
Monocytes Absolute: 0.6 10*3/uL (ref 0.1–1.0)
Monocytes Relative: 8.5 % (ref 3.0–12.0)
Neutro Abs: 3.7 10*3/uL (ref 1.4–7.7)
Neutrophils Relative %: 56.3 % (ref 43.0–77.0)
Platelets: 218 10*3/uL (ref 150.0–400.0)
RBC: 4.94 Mil/uL (ref 3.87–5.11)
RDW: 13.3 % (ref 11.5–15.5)
WBC: 6.5 10*3/uL (ref 4.0–10.5)

## 2023-03-10 LAB — URINALYSIS, ROUTINE W REFLEX MICROSCOPIC
Bilirubin Urine: NEGATIVE
Ketones, ur: NEGATIVE
Leukocytes,Ua: NEGATIVE
Nitrite: NEGATIVE
Specific Gravity, Urine: 1.015 (ref 1.000–1.030)
Total Protein, Urine: NEGATIVE
Urine Glucose: NEGATIVE
Urobilinogen, UA: 0.2 (ref 0.0–1.0)
pH: 7.5 (ref 5.0–8.0)

## 2023-03-10 LAB — MICROALBUMIN / CREATININE URINE RATIO
Creatinine,U: 135.9 mg/dL
Microalb Creat Ratio: 0.5 mg/g (ref 0.0–30.0)
Microalb, Ur: 0.7 mg/dL (ref 0.0–1.9)

## 2023-03-10 LAB — TSH: TSH: 2.24 u[IU]/mL (ref 0.35–5.50)

## 2023-03-10 LAB — HEMOGLOBIN A1C: Hgb A1c MFr Bld: 5.8 % (ref 4.6–6.5)

## 2023-03-15 ENCOUNTER — Encounter: Payer: Self-pay | Admitting: Family Medicine

## 2023-03-15 ENCOUNTER — Ambulatory Visit (INDEPENDENT_AMBULATORY_CARE_PROVIDER_SITE_OTHER): Payer: 59 | Admitting: Family Medicine

## 2023-03-15 VITALS — BP 122/76 | HR 65 | Temp 97.6°F | Ht 70.0 in | Wt 163.0 lb

## 2023-03-15 DIAGNOSIS — L989 Disorder of the skin and subcutaneous tissue, unspecified: Secondary | ICD-10-CM | POA: Diagnosis not present

## 2023-03-15 DIAGNOSIS — R7303 Prediabetes: Secondary | ICD-10-CM

## 2023-03-15 DIAGNOSIS — M810 Age-related osteoporosis without current pathological fracture: Secondary | ICD-10-CM | POA: Diagnosis not present

## 2023-03-15 DIAGNOSIS — E782 Mixed hyperlipidemia: Secondary | ICD-10-CM | POA: Diagnosis not present

## 2023-03-15 DIAGNOSIS — Z23 Encounter for immunization: Secondary | ICD-10-CM

## 2023-03-15 DIAGNOSIS — E890 Postprocedural hypothyroidism: Secondary | ICD-10-CM | POA: Diagnosis not present

## 2023-03-15 DIAGNOSIS — I8392 Asymptomatic varicose veins of left lower extremity: Secondary | ICD-10-CM

## 2023-03-15 DIAGNOSIS — M7918 Myalgia, other site: Secondary | ICD-10-CM | POA: Diagnosis not present

## 2023-03-15 DIAGNOSIS — Z0000 Encounter for general adult medical examination without abnormal findings: Secondary | ICD-10-CM

## 2023-03-15 LAB — VITAMIN D 1,25 DIHYDROXY
Vitamin D 1, 25 (OH)2 Total: 32 pg/mL (ref 18–72)
Vitamin D2 1, 25 (OH)2: <8 pg/mL
Vitamin D3 1, 25 (OH)2: 32 pg/mL

## 2023-03-15 MED ORDER — CALCIUM 600+D3 PLUS MINERALS PO TABS
1.0000 | ORAL_TABLET | Freq: Every day | ORAL | Status: DC
Start: 2023-03-15 — End: 2023-08-11

## 2023-03-15 NOTE — Assessment & Plan Note (Signed)
Osteoporosis: Patient on Alendronate 70mg  once weekly. Recent labs pending for Vitamin D levels. -Recommend over-the-counter calcium and vitamin D supplement. -Continue Alendronate 70mg  once weekly.

## 2023-03-15 NOTE — Assessment & Plan Note (Addendum)
Non-healing skin lesion on nose: Present since January, small, red, raised, and non-painful. Concern for potential skin cancer due to sun exposure and persistent nature of the lesion. -Refer to dermatology for evaluation and potential biopsy/removal.

## 2023-03-15 NOTE — Assessment & Plan Note (Signed)
Hyperlipidemia: Elevated cholesterol levels despite healthy diet and exercise. No current indication for medication therapy. -Continue healthy diet and exercise. -Repeat cholesterol testing in one year.

## 2023-03-15 NOTE — Patient Instructions (Addendum)
VISIT SUMMARY:  During your visit, we discussed your concerns about the persistent spot on your nose and the chronic pain in your right hip. We also reviewed your ongoing conditions of hypothyroidism, prediabetes, osteoporosis, varicose veins, and elevated cholesterol levels. Your recent lab results indicate stable thyroid function and a slightly elevated blood sugar level, suggesting prediabetes. Your cholesterol levels were found to be high, but you have been managing this with dietary changes.  YOUR PLAN:  -NON-HEALING SKIN LESION ON NOSE: The persistent spot on your nose could potentially be skin cancer due to its nature and your sun exposure. We will refer you to a dermatologist for further evaluation and potential removal of the spot.  -RIGHT HIP PAIN: Your hip pain, which radiates to the groin, could be due to a fracture or other structural abnormalities. We will order an X-ray of your hip and lower back to investigate further. Continue taking ibuprofen as needed for pain relief.  For xray, go to:    [facility] [address] [place], [place], [place] [address] Phone: [phone]   -HYPERLIPIDEMIA: Your cholesterol levels are high despite your healthy diet and exercise. Continue with your healthy lifestyle and we will repeat the cholesterol test in a year.  -OSTEOPOROSIS: You are currently taking Alendronate for your osteoporosis. We recommend adding an over-the-counter calcium and vitamin D supplement to your regimen. Continue taking Alendronate as prescribed.  -VARICOSE VEINS: You have been managing your varicose veins in your left leg with compression stockings. Continue using them as they have been effective.  INSTRUCTIONS:  We will administer the second shingles vaccine today. We will repeat your labs, including thyroid, hemoglobin A1c, micro creatinine ratio, Vitamin D, CBC, CMP, in six months. Please schedule a follow-up appointment in six months.

## 2023-03-15 NOTE — Progress Notes (Signed)
Assessment  Assessment/Plan:   Problem List Items Addressed This Visit       Cardiovascular and Mediastinum   Asymptomatic varicose veins of left lower extremity    Varicose veins: Present in left leg, managed with compression stockings. -Continue compression stockings.           Endocrine   Postsurgical hypothyroidism   Relevant Orders   TSH   Lipid panel   Hemoglobin A1c   Microalbumin / creatinine urine ratio   Urinalysis, Routine w reflex microscopic   CBC with Differential/Platelet   Comprehensive metabolic panel     Musculoskeletal and Integument   Age-related osteoporosis without current pathological fracture    Osteoporosis: Patient on Alendronate 70mg  once weekly. Recent labs pending for Vitamin D levels. -Recommend over-the-counter calcium and vitamin D supplement. -Continue Alendronate 70mg  once weekly.       Relevant Medications   Multiple Minerals-Vitamins (CALCIUM 600+D3 PLUS MINERALS) TABS   Other Relevant Orders   Vitamin D 1,25 dihydroxy   Skin lesion - Primary    Non-healing skin lesion on nose: Present since January, small, red, raised, and non-painful. Concern for potential skin cancer due to sun exposure and persistent nature of the lesion. -Refer to dermatology for evaluation and potential biopsy/removal.      Relevant Orders   Ambulatory referral to Dermatology     Other   Moderate mixed hyperlipidemia not requiring statin therapy    Hyperlipidemia: Elevated cholesterol levels despite healthy diet and exercise. No current indication for medication therapy. -Continue healthy diet and exercise. -Repeat cholesterol testing in one year.       Relevant Orders   TSH   Lipid panel   Hemoglobin A1c   Microalbumin / creatinine urine ratio   Urinalysis, Routine w reflex microscopic   CBC with Differential/Platelet   Comprehensive metabolic panel   Right buttock pain    Radiating to the groin, present for two years, intensity stable but  frequency increasing. Pain is relieved by rest, ibuprofen, and ice. No known injury or trigger. History of osteoporosis. -Order hip and lower back X-ray to rule out fracture or other structural abnormalities. -Continue ibuprofen as needed for pain.      Relevant Orders   DG Lumbar Spine Complete   Prediabetes   Relevant Orders   TSH   Lipid panel   Hemoglobin A1c   Microalbumin / creatinine urine ratio   Urinalysis, Routine w reflex microscopic   CBC with Differential/Platelet   Comprehensive metabolic panel   Other Visit Diagnoses     Immunization due       Relevant Orders   Varicella-zoster vaccine IM (Completed)   Encounter for well adult exam without abnormal findings          General Health Maintenance: -Administer second shingles vaccine today. -Repeat labs (including thyroid, hemoglobin A1c, micro creatinine ratio, Vitamin D, CBC, CMP) in six months. -Follow-up appointment in six months.     Medications Discontinued During This Encounter  Medication Reason   ALPRAZolam (XANAX) 0.25 MG tablet    ALPRAZolam (XANAX) 0.25 MG tablet    sertraline (ZOLOFT) 100 MG tablet     Patient Counseling(The following topics were reviewed and/or handout was given):  -Nutrition: Stressed importance of moderation in sodium/caffeine intake, saturated fat and cholesterol, caloric balance, sufficient intake of fresh fruits, vegetables, and fiber.  -Stressed the importance of regular exercise.   -Substance Abuse: Discussed cessation/primary prevention of tobacco, alcohol, or other drug use; driving or other dangerous activities  under the influence; availability of treatment for abuse.   -Injury prevention: Discussed safety belts, safety helmets, smoke detector, smoking near bedding or upholstery.   -Sexuality: Discussed sexually transmitted diseases, partner selection, use of condoms, avoidance of unintended pregnancy and contraceptive alternatives.   -Dental health: Discussed importance  of regular tooth brushing, flossing, and dental visits.  -Health maintenance and immunizations reviewed. Please refer to Health maintenance section.  Return to care in 1 year for next preventative visit.       Subjective:  Chief complaint Encounter date: 03/15/2023  Chief Complaint  Patient presents with   Annual Exam    Would like 2nd shingles   Elizabeth Obrien is a 62 y.o. female who presents today for her annual comprehensive physical exam.    Discussed the use of AI scribe software for clinical note transcription with the patient, who gave verbal consent to proceed.  History of Present Illness   The patient, with a history of hypothyroidism and prediabetes, presents with two primary concerns. Firstly, she has noticed a persistent spot on her nose that has been present since January. Despite attempts to treat it with antibiotics and band-aids, the spot has not healed and appears to be increasing in size. The patient has not experienced any pain associated with this spot.  Secondly, she reports a chronic pain in her right hip area, which has been present for approximately two years. The pain, described as a shooting sensation, radiates from the hip to the inside of the groin. The patient rates the pain as a 4 on a scale of 10, noting that it subsides after shooting. The frequency of the pain appears to be increasing, although the intensity remains the same. The patient has been managing the pain with ibuprofen and ice, and notes that rest seems to alleviate the symptoms. No specific triggers have been identified, and she denies any associated injury.  In addition to these concerns, she also reports a history of osteoporosis and is currently on alendronate. She has been experiencing issues with varicose veins in her left leg, which she has been managing with compression stockings for the past 20 years. The patient denies any swelling in her feet.  The patient's recent lab results  indicate stable thyroid function and a hemoglobin A1c of 5.8, suggesting prediabetes. She has been advised to continue a healthy, low-carb diet. Other lab results, including urinalysis, vitamin D, CBC, and CMP, were reported as normal. The patient's cholesterol levels were found to be elevated, but she has been managing this with dietary modifications.       Review of Systems  Musculoskeletal:  Positive for joint pain (Right hip).  Skin:  Positive for rash (nose).  All other systems reviewed and are negative.      02/15/2023    9:28 AM  GAD-7 Generalized Anxiety Disorder Screening Tool  1. Feeling Nervous, Anxious, or on Edge 0  2. Not Being Able to Stop or Control Worrying 0  3. Worrying Too Much About Different Things 0  4. Trouble Relaxing 0  5. Being So Restless it's Hard To Sit Still 0  6. Becoming Easily Annoyed or Irritable 0  7. Feeling Afraid As If Something Awful Might Happen 0  Total GAD-7 Score 0  Difficulty At Work, Home, or Getting  Along With Others? Not difficult at all      02/15/2023    9:28 AM 12/17/2021    7:02 AM 11/15/2020    9:26 AM 05/31/2018   11:41  AM 01/27/2018    7:04 AM 01/15/2017   10:21 AM  Depression screen PHQ 2/9  Decreased Interest 0 0 0 1 1 0  Down, Depressed, Hopeless 0 0 0 2 1 1   PHQ - 2 Score 0 0 0 3 2 1   Altered sleeping 0   1 1   Tired, decreased energy 0   1 1   Change in appetite 0   2 2   Feeling bad or failure about yourself  0   2 0   Trouble concentrating 0   1 0   Moving slowly or fidgety/restless 0   2 0   Suicidal thoughts 0   0 0   PHQ-9 Score 0   12 6   Difficult doing work/chores Not difficult at all         There are no preventive care reminders to display for this patient.    Dental: UTD Vision: UTD  PMH: The 10-year ASCVD risk score (Arnett DK, et al., 2019) is: 4%   Values used to calculate the score:     Age: 76 years     Sex: Female     Is Non-Hispanic African American: No     Diabetic: No     Tobacco  smoker: No     Systolic Blood Pressure: 122 mmHg     Is BP treated: No     HDL Cholesterol: 66.4 mg/dL     Total Cholesterol: 222 mg/dL  The following were reviewed and entered/updated in epic: Past Medical History:  Diagnosis Date   Anxiety and depression    Elevated blood sugar    Hx of prediabetes   GERD (gastroesophageal reflux disease)    Hand eczema    Hypothyroidism, iatrogenic    Follow by Dr. Lurene Shadow, s/p partial thyroidectomy for nodule   Migraine    hx cluster type    Patient Active Problem List   Diagnosis Date Noted   Skin lesion 03/15/2023   Moderate mixed hyperlipidemia not requiring statin therapy 03/15/2023   Right buttock pain 03/15/2023   Prediabetes 03/15/2023   Asymptomatic varicose veins of left lower extremity 03/15/2023   Age-related osteoporosis without current pathological fracture 02/17/2023   Atrophic vaginitis 02/10/2019   Anxiety and depression 12/19/2014   Postsurgical hypothyroidism 12/19/2014   GERD (gastroesophageal reflux disease) 09/20/2014   Hand eczema 09/20/2014   Migraine 01/13/2008    Past Surgical History:  Procedure Laterality Date   BREAST SURGERY  09/07/1997   augmentation - saline   COLONOSCOPY  03/31/2016   Dr.Nandigam   COLONOSCOPY  01/23/2022   OPEN REDUCTION INTERNAL FIXATION (ORIF) DISTAL RADIAL FRACTURE Right 11/27/2014   Procedure: OPEN REDUCTION INTERNAL FIXATION (ORIF) RIGHT  DISTAL RADIAL FRACTURE WITH ALLOGRAFT BONE GRAFTING AND REPAIR;  Surgeon: Dominica Severin, MD;  Location: [facility] OR;  Service: Orthopedics;  Laterality: Right;   POLYPECTOMY     right knee  09/07/2002   arthroscopic   THYROID SURGERY  09/07/2009    Family History  Problem Relation Age of Onset   Cancer Mother 6       breast, stomach ca   Colon polyps Father    Colon cancer Father 2   Colon polyps Paternal Aunt    Colon cancer Paternal Aunt    Colon polyps Paternal Grandmother    Colon cancer Paternal Grandmother    Cancer Other         thyroid   Esophageal cancer Neg Hx    Stomach cancer Neg  Hx    Rectal cancer Neg Hx     Medications- reviewed and updated Outpatient Medications Prior to Visit  Medication Sig Dispense Refill   alendronate (FOSAMAX) 70 MG tablet Take 1 tablet (70 mg total) by mouth once a week. 12 tablet 3   Ascorbic Acid (VITAMIN C) 1000 MG tablet Take 1,000 mg by mouth daily. Reported on 03/17/2016     aspirin-acetaminophen-caffeine (EXCEDRIN MIGRAINE) 250-250-65 MG per tablet Take by mouth every 6 (six) hours as needed for headache.     conjugated estrogens (PREMARIN) vaginal cream Insert 1/2 gram vaginally twice a week 30 g 3   levothyroxine (SYNTHROID) 75 MCG tablet Take 1 tablet (75 mcg total) by mouth daily before breakfast. Please call [phone] to schedule an appointment for further refills. 90 tablet 0   sertraline (ZOLOFT) 100 MG tablet Take 1 tablet (100 mg total) by mouth daily. 90 tablet 4   sertraline (ZOLOFT) 100 MG tablet Take 1 tablet by mouth every day. 90 tablet 4   SUMAtriptan (IMITREX) 100 MG tablet TAKE 1 TABLET BY MOUTH AT ONSET OF MIGRAINE MAY REPEAT ONCE IN 2 HOURS 9 tablet 3   ALPRAZolam (XANAX) 0.25 MG tablet TAKE 1 TABLET BY MOUTH THREE TIMES DAILY AS NEEDED (Patient not taking: Reported on 03/15/2023) 60 tablet 0   ALPRAZolam (XANAX) 0.25 MG tablet TAKE 1 TABLET BY MOUTH THREE TIMES DAILY AS NEEDED (Patient not taking: Reported on 03/15/2023) 30 tablet 0   No facility-administered medications prior to visit.    Allergies  Allergen Reactions   Neosporin Original [Neomycin-Bacitracin Zn-Polymyx] Hives   Other Hives and Rash    Topical antibiotics    Social History   Socioeconomic History   Marital status: Married    Spouse name: Not on file   Number of children: 2   Years of education: Not on file   Highest education level: Not on file  Occupational History   Occupation: PACE nurse    Employer: [employer]  Tobacco Use   Smoking status: Former    Types: Cigarettes     Quit date: 10/27/2000    Years since quitting: 22.3    Passive exposure: Never   Smokeless tobacco: Never   Tobacco comments:    smoked for 10 years, quit in 2002  Vaping Use   Vaping Use: Never used  Substance and Sexual Activity   Alcohol use: Never   Drug use: Never   Sexual activity: Yes    Birth control/protection: Post-menopausal  Other Topics Concern   Not on file  Social History Narrative   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 [employer] Situation: lives with husband      Spiritual Beliefs: jewish      Lifestyle: no regular cv exercise; diet is healthy      Social Determinants of Corporate investment banker Strain: Not on file  Food Insecurity: Not on file  Transportation Needs: Not on file  Physical Activity: Not on file  Stress: Not on file  Social Connections: Not on file        Objective:  Physical Exam: BP 122/76 (BP Location: Left Arm, Patient Position: Sitting, Cuff Size: Large)   Pulse 65   Temp 97.6 F (36.4 C) (Temporal)   Ht 5\' 10"  (1.778 m)   Wt 163 lb (73.9 kg)   LMP  (LMP Unknown)   SpO2 97%   BMI 23.39 kg/m   Body mass index is 23.39 kg/m. Wt  Readings from Last 3 Encounters:  03/15/23 163 lb (73.9 kg)  02/15/23 161 lb 6.4 oz (73.2 kg)  01/23/22 159 lb (72.1 kg)    Physical Exam Constitutional:      General: She is not in acute distress.    Appearance: Normal appearance. She is not ill-appearing or toxic-appearing.  HENT:     Head: Normocephalic and atraumatic.     Right Ear: Hearing, tympanic membrane, ear canal and external ear normal. There is no impacted cerumen.     Left Ear: Hearing, tympanic membrane, ear canal and external ear normal. There is no impacted cerumen.     Nose: Nose normal. No congestion.     Mouth/Throat:     Lips: No lesions.     Mouth: Mucous membranes are moist.     Pharynx: Oropharynx is clear. No oropharyngeal exudate.  Eyes:     General: No scleral icterus.       Right eye: No discharge.         Left eye: No discharge.     Conjunctiva/sclera: Conjunctivae normal.     Pupils: Pupils are equal, round, and reactive to light.  Neck:     Thyroid: No thyroid mass, thyromegaly or thyroid tenderness.  Cardiovascular:     Rate and Rhythm: Normal rate and regular rhythm.     Pulses: Normal pulses.     Heart sounds: Normal heart sounds.  Pulmonary:     Effort: Pulmonary effort is normal. No respiratory distress.     Breath sounds: Normal breath sounds.  Abdominal:     General: Abdomen is flat. Bowel sounds are normal.     Palpations: Abdomen is soft.  Musculoskeletal:        General: Normal range of motion.     Cervical back: Normal range of motion.     Lumbar back: Normal. Negative right straight leg raise test and negative left straight leg raise test.     Right upper leg: Tenderness (Right posterior buttock, also elicited when flexed to 90 degrees with knee fully extended, negative FADIR and FABER) present.     Right lower leg: No edema.     Left lower leg: Edema (Multiple varicose veins without ulcer) present.  Lymphadenopathy:     Cervical: No cervical adenopathy.  Skin:    General: Skin is warm and dry.     Findings: Lesion (On bridge of nose) present. No rash.     Comments: Small, red, raised lesion on the bridge of the nose.  <!--RTF_E-->   Neurological:     General: No focal deficit present.     Mental Status: She is alert and oriented to person, place, and time. Mental status is at baseline.     Deep Tendon Reflexes:     Reflex Scores:      Patellar reflexes are 2+ on the right side and 2+ on the left side. Psychiatric:        Mood and Affect: Mood normal.        Behavior: Behavior normal.        Thought Content: Thought content normal.        Judgment: Judgment normal.         At today's visit, we discussed treatment options, associated risk and benefits, and engage in counseling as needed.  Additionally the following were reviewed: Past medical records,  past medical and surgical history, family and social background, as well as relevant laboratory results, imaging findings, and specialty notes, where applicable.  This message was generated using dictation software, and as a result, it may contain unintentional typos or errors.  Nevertheless, extensive effort was made to accurately convey at the pertinent aspects of the patient visit.    There may have been are other unrelated non-urgent complaints, but due to the busy schedule and the amount of time already spent with her, time does not permit to address these issues at today's visit. Another appointment may have or has been requested to review these additional issues.   Thomes Dinning, MD, MS

## 2023-03-15 NOTE — Assessment & Plan Note (Signed)
Radiating to the groin, present for two years, intensity stable but frequency increasing. Pain is relieved by rest, ibuprofen, and ice. No known injury or trigger. History of osteoporosis. -Order hip and lower back X-ray to rule out fracture or other structural abnormalities. -Continue ibuprofen as needed for pain.

## 2023-03-15 NOTE — Assessment & Plan Note (Signed)
Varicose veins: Present in left leg, managed with compression stockings. -Continue compression stockings.

## 2023-03-16 ENCOUNTER — Ambulatory Visit (INDEPENDENT_AMBULATORY_CARE_PROVIDER_SITE_OTHER)
Admission: RE | Admit: 2023-03-16 | Discharge: 2023-03-16 | Disposition: A | Discharge status: Home / Self Care | Payer: 59 | Source: Ambulatory Visit | Attending: Family Medicine | Admitting: Family Medicine

## 2023-03-16 DIAGNOSIS — M47816 Spondylosis without myelopathy or radiculopathy, lumbar region: Secondary | ICD-10-CM | POA: Diagnosis not present

## 2023-03-16 DIAGNOSIS — M7918 Myalgia, other site: Secondary | ICD-10-CM

## 2023-03-16 DIAGNOSIS — M545 Low back pain, unspecified: Secondary | ICD-10-CM | POA: Diagnosis not present

## 2023-03-16 DIAGNOSIS — M5136 Other intervertebral disc degeneration, lumbar region: Secondary | ICD-10-CM | POA: Diagnosis not present

## 2023-05-05 ENCOUNTER — Other Ambulatory Visit: Payer: Self-pay

## 2023-05-05 ENCOUNTER — Other Ambulatory Visit (HOSPITAL_COMMUNITY): Payer: Self-pay

## 2023-05-05 ENCOUNTER — Other Ambulatory Visit: Payer: Self-pay | Admitting: Family Medicine

## 2023-05-05 DIAGNOSIS — E890 Postprocedural hypothyroidism: Secondary | ICD-10-CM

## 2023-05-05 MED ORDER — PREMARIN 0.625 MG/GM VA CREA
0.5000 g | TOPICAL_CREAM | VAGINAL | 0 refills | Status: AC
  Filled 2023-05-05: qty 30, 90d supply, fill #0

## 2023-05-05 MED ORDER — SERTRALINE HCL 100 MG PO TABS
100.0000 mg | ORAL_TABLET | Freq: Every day | ORAL | 0 refills | Status: DC
  Filled 2023-05-05: qty 90, 90d supply, fill #0

## 2023-05-05 MED ORDER — LEVOTHYROXINE SODIUM 75 MCG PO TABS
75.0000 ug | ORAL_TABLET | Freq: Every day | ORAL | 0 refills | Status: DC
Start: 2023-05-05 — End: 2023-07-23
  Filled 2023-05-05: qty 90, 90d supply, fill #0

## 2023-05-06 ENCOUNTER — Other Ambulatory Visit: Payer: Self-pay

## 2023-05-06 ENCOUNTER — Other Ambulatory Visit (HOSPITAL_COMMUNITY): Payer: Self-pay

## 2023-05-20 ENCOUNTER — Other Ambulatory Visit (HOSPITAL_COMMUNITY): Payer: Self-pay

## 2023-06-07 ENCOUNTER — Other Ambulatory Visit (HOSPITAL_COMMUNITY): Payer: Self-pay

## 2023-07-12 ENCOUNTER — Ambulatory Visit: Payer: 59 | Admitting: Dermatology

## 2023-07-23 ENCOUNTER — Other Ambulatory Visit: Payer: Self-pay | Admitting: Family Medicine

## 2023-07-23 ENCOUNTER — Other Ambulatory Visit (HOSPITAL_COMMUNITY): Payer: Self-pay

## 2023-07-23 DIAGNOSIS — E890 Postprocedural hypothyroidism: Secondary | ICD-10-CM

## 2023-07-23 MED ORDER — LEVOTHYROXINE SODIUM 75 MCG PO TABS
75.0000 ug | ORAL_TABLET | Freq: Every day | ORAL | 0 refills | Status: DC
Start: 2023-07-23 — End: 2023-10-21
  Filled 2023-07-23: qty 90, 90d supply, fill #0

## 2023-08-11 ENCOUNTER — Other Ambulatory Visit (HOSPITAL_COMMUNITY): Payer: Self-pay

## 2023-08-11 ENCOUNTER — Other Ambulatory Visit: Payer: Self-pay | Admitting: Family Medicine

## 2023-08-11 ENCOUNTER — Other Ambulatory Visit: Payer: Self-pay

## 2023-08-11 DIAGNOSIS — M810 Age-related osteoporosis without current pathological fracture: Secondary | ICD-10-CM

## 2023-08-11 MED ORDER — CALCIUM 600+D3 PLUS MINERALS PO TABS
1.0000 | ORAL_TABLET | Freq: Every day | ORAL | Status: AC
Start: 2023-08-11 — End: ?

## 2023-08-12 ENCOUNTER — Ambulatory Visit (INDEPENDENT_AMBULATORY_CARE_PROVIDER_SITE_OTHER): Payer: 59 | Admitting: Dermatology

## 2023-08-12 ENCOUNTER — Encounter: Payer: Self-pay | Admitting: Dermatology

## 2023-08-12 ENCOUNTER — Other Ambulatory Visit (HOSPITAL_COMMUNITY): Payer: Self-pay

## 2023-08-12 ENCOUNTER — Other Ambulatory Visit: Payer: Self-pay

## 2023-08-12 VITALS — BP 122/78 | HR 72

## 2023-08-12 DIAGNOSIS — L570 Actinic keratosis: Secondary | ICD-10-CM | POA: Diagnosis not present

## 2023-08-12 DIAGNOSIS — R7301 Impaired fasting glucose: Secondary | ICD-10-CM | POA: Insufficient documentation

## 2023-08-12 DIAGNOSIS — K130 Diseases of lips: Secondary | ICD-10-CM | POA: Diagnosis not present

## 2023-08-12 DIAGNOSIS — L531 Erythema annulare centrifugum: Secondary | ICD-10-CM

## 2023-08-12 DIAGNOSIS — W908XXA Exposure to other nonionizing radiation, initial encounter: Secondary | ICD-10-CM

## 2023-08-12 DIAGNOSIS — F329 Major depressive disorder, single episode, unspecified: Secondary | ICD-10-CM | POA: Insufficient documentation

## 2023-08-12 MED ORDER — NYSTATIN-TRIAMCINOLONE 100000-0.1 UNIT/GM-% EX OINT
1.0000 | TOPICAL_OINTMENT | Freq: Two times a day (BID) | CUTANEOUS | 0 refills | Status: AC
Start: 2023-08-12 — End: ?
  Filled 2023-08-12: qty 60, 30d supply, fill #0

## 2023-08-12 NOTE — Patient Instructions (Addendum)
Hello [name],  Thank you for visiting my office today. Your dedication to enhancing your skin health is greatly appreciated. Below is a summary of the essential instructions from our consultation today:  - Nystatin Triamcinolone Ointment:   - Application: Apply this ointment to the affected area around your mouth.   - Duration: Use for 10 days to address angular cheilitis. Please discontinue after 10 days to prevent skin thinning.  - Aquaphor:   - Usage: Apply nightly as a preventive measure against angular cheilitis.  - Diet and Oral Hygiene:   - Guidelines: Avoid eating right before bed and ensure to drink water or brush your teeth after eating to prevent irritation.  - Actinic Keratosis Treatment:   - Procedure: Treated today with liquid nitrogen. The treated spot may become red and crusty but is expected to heal over time.   - Additional Info: You may use makeup to cover the treated area if necessary.  - Prescription Handling:   - Pharmacy: Your prescription for Nystatin Triamcinolone will be sent to your regular pharmacy. In case of insurance issues, I will provide two separate prescriptions for mixing.  Please feel free to schedule a follow-up appointment or a cosmetic consult to further discuss your skincare regimen. Wishing you a wonderful holiday season and take care!  Warm regards,  Dr. [facility]    Cryotherapy Aftercare  Wash gently with soap and water everyday.   Apply Vaseline and Band-Aid daily until healed.   Important Information   Due to recent changes in healthcare laws, you may see results of your pathology and/or laboratory studies on MyChart before the doctors have had a chance to review them. We understand that in some cases there may be results that are confusing or concerning to you. Please understand that not all results are received at the same time and often the doctors may need to interpret multiple results in order to provide  you with the best plan of care or course of treatment. Therefore, we ask that you please give [place] 2 business days to thoroughly review all your results before contacting the office for clarification. Should we see a critical lab result, you will be contacted sooner.     If You Need Anything After Your Visit   If you have any questions or concerns for your doctor, please call our main line at [phone]. If no one answers, please leave a voicemail as directed and we will return your call as soon as possible. Messages left after 4 pm will be answered the following business day.    You may also send [place] a message via MyChart. We typically respond to MyChart messages within 1-2 business days.  For prescription refills, please ask your pharmacy to contact our office. Our fax number is [phone].  If you have an urgent issue when the clinic is closed that cannot wait until the next business day, you can page your doctor at the number below.     Please note that while we do our best to be available for urgent issues outside of office hours, we are not available 24/7.    If you have an urgent issue and are unable to reach [place], you may choose to seek medical care at your doctor's office, retail clinic, urgent care center, or emergency room.   If you have a medical emergency, please immediately call 911 or go to the emergency department. In the event of inclement weather, please call our main line at  [phone] for an update on the status of any delays or closures.  Dermatology Medication Tips: Please keep the boxes that topical medications come in in order to help keep track of the instructions about where and how to use these. Pharmacies typically print the medication instructions only on the boxes and not directly on the medication tubes.   If your medication is too expensive, please contact our office at [phone] or send [place] a message through MyChart.    We are unable to tell what your  co-pay for medications will be in advance as this is different depending on your insurance coverage. However, we may be able to find a substitute medication at lower cost or fill out paperwork to get insurance to cover a needed medication.    If a prior authorization is required to get your medication covered by your insurance company, please allow [place] 1-2 business days to complete this process.   Drug prices often vary depending on where the prescription is filled and some pharmacies may offer cheaper prices.   The website www.goodrx.com contains coupons for medications through different pharmacies. The prices here do not account for what the cost may be with help from insurance (it may be cheaper with your insurance), but the website can give you the price if you did not use any insurance.  - You can print the associated coupon and take it with your prescription to the pharmacy.  - You may also stop by our office during regular business hours and pick up a GoodRx coupon card.  - If you need your prescription sent electronically to a different pharmacy, notify our office through [facility] or by phone at [phone]

## 2023-08-12 NOTE — Progress Notes (Signed)
New Patient Visit   Subjective  Elizabeth Obrien is a 62 y.o. female who presents for the following: Skin Lesion and rash around mouth  Patient states she has skin lesion located at the Nose and rash around mouth that she would like to have examined. Patient reports the areas have been there for 1 year & the rash has been there for about 9 months. She reports the areas are bothersome. She states the area on her nose is painful and doesn't seem to heal. The area around her mouth is a burning sensation. Patient rates irritation 5 out of 10. She states that the areas have not spread. Patient reports she has not previously been treated for these areas. Patient denies Hx of bx. Patient denies family history of skin cancer(s).  The patient has spots, moles and lesions to be evaluated, some may be new or changing and the patient may have concern these could be cancer.  The following portions of the chart were reviewed this encounter and updated as appropriate: medications, allergies, medical history  Review of Systems:  No other skin or systemic complaints except as noted in HPI or Assessment and Plan.  Objective  Well appearing patient in no apparent distress; mood and affect are within normal limits.  A focused examination was performed of the following areas: Nose and Mouth  Relevant exam findings are noted in the Assessment and Plan.       Assessment & Plan   ACTINIC KERATOSIS Exam: Erythematous thin papules/macules with gritty scale on nasal bridge  Actinic keratoses are precancerous spots that appear secondary to cumulative UV radiation exposure/sun exposure over time. They are chronic with expected duration over 1 year. A portion of actinic keratoses will progress to squamous cell carcinoma of the skin. It is not possible to reliably predict which spots will progress to skin cancer and so treatment is recommended to prevent development of skin cancer.  Recommend daily broad  spectrum sunscreen SPF 30+ to sun-exposed areas, reapply every 2 hours as needed.  Recommend staying in the shade or wearing long sleeves, sun glasses (UVA+UVB protection) and wide brim hats (4-inch brim around the entire circumference of the hat). Call for new or changing lesions.  Treatment Plan:  Prior to procedure, discussed risks of blister formation, small wound, skin dyspigmentation, or rare scar following cryotherapy. Recommend Vaseline ointment to treated areas while healing.  Destruction Procedure Note Destruction method: cryotherapy   Informed consent: discussed and consent obtained   Lesion destroyed using liquid nitrogen: Yes   Outcome: patient tolerated procedure well with no complications   Post-procedure details: wound care instructions given   Locations: Nose # of Lesions Treated: 1  Prior to procedure, discussed risks of blister formation, small wound, skin dyspigmentation, or rare scar following cryotherapy. Recommend Vaseline ointment to treated areas while healing.    2. Angular Cheilitis (Perleche) - Assessment: Patient presents with irritation around the mouth, described as cracking on the corners for approximately 8 months. The condition is diagnosed as angular cheilitis (perleche), attributed to irritation from saliva. - Plan: Prescribe nystatin triamcinolone ointment for treatment. Instruct patient to apply ointment for 10 days, then discontinue to prevent skin thinning. Recommend using Aquaphor before bed for prevention. Advise patient to be mindful of eating before bed and to drink water or brush teeth after eating.    No follow-ups on file.    Documentation: I have reviewed the above documentation for accuracy and completeness, and I agree with the above.  Stasia Cavalier, am acting as scribe for Langston Reusing, DO.   Langston Reusing, DO

## 2023-10-04 ENCOUNTER — Other Ambulatory Visit (HOSPITAL_COMMUNITY): Payer: Self-pay

## 2023-10-10 ENCOUNTER — Other Ambulatory Visit (HOSPITAL_COMMUNITY): Payer: Self-pay

## 2023-10-11 ENCOUNTER — Other Ambulatory Visit (HOSPITAL_COMMUNITY): Payer: Self-pay

## 2023-10-12 ENCOUNTER — Other Ambulatory Visit (HOSPITAL_COMMUNITY): Payer: Self-pay

## 2023-10-21 ENCOUNTER — Ambulatory Visit (INDEPENDENT_AMBULATORY_CARE_PROVIDER_SITE_OTHER): Payer: Medicaid Other | Admitting: Family Medicine

## 2023-10-21 ENCOUNTER — Encounter: Payer: Self-pay | Admitting: Family Medicine

## 2023-10-21 ENCOUNTER — Other Ambulatory Visit (HOSPITAL_COMMUNITY): Payer: Self-pay

## 2023-10-21 VITALS — BP 112/76 | HR 80 | Temp 97.5°F | Wt 167.4 lb

## 2023-10-21 DIAGNOSIS — F324 Major depressive disorder, single episode, in partial remission: Secondary | ICD-10-CM | POA: Diagnosis not present

## 2023-10-21 DIAGNOSIS — E890 Postprocedural hypothyroidism: Secondary | ICD-10-CM | POA: Diagnosis not present

## 2023-10-21 DIAGNOSIS — Z23 Encounter for immunization: Secondary | ICD-10-CM

## 2023-10-21 DIAGNOSIS — M810 Age-related osteoporosis without current pathological fracture: Secondary | ICD-10-CM | POA: Diagnosis not present

## 2023-10-21 DIAGNOSIS — G43909 Migraine, unspecified, not intractable, without status migrainosus: Secondary | ICD-10-CM

## 2023-10-21 MED ORDER — ALENDRONATE SODIUM 70 MG PO TABS
70.0000 mg | ORAL_TABLET | ORAL | 3 refills | Status: AC
Start: 2023-10-21 — End: 2024-10-18
  Filled 2023-10-21: qty 12, 84d supply, fill #0
  Filled 2024-01-25: qty 12, 84d supply, fill #1
  Filled 2024-04-18: qty 12, 84d supply, fill #2
  Filled 2024-07-25: qty 12, 84d supply, fill #3

## 2023-10-21 MED ORDER — SUMATRIPTAN SUCCINATE 100 MG PO TABS
ORAL_TABLET | ORAL | 3 refills | Status: AC
Start: 2023-10-21 — End: 2024-10-20
  Filled 2023-10-21: qty 9, 30d supply, fill #0
  Filled 2024-01-25: qty 9, 30d supply, fill #1
  Filled 2024-07-25: qty 9, 30d supply, fill #2

## 2023-10-21 MED ORDER — SERTRALINE HCL 100 MG PO TABS
100.0000 mg | ORAL_TABLET | Freq: Every day | ORAL | 3 refills | Status: AC
Start: 2023-10-21 — End: 2024-10-24
  Filled 2023-10-21: qty 90, 90d supply, fill #0
  Filled 2024-01-25: qty 90, 90d supply, fill #1
  Filled 2024-07-25: qty 90, 90d supply, fill #2

## 2023-10-21 MED ORDER — LEVOTHYROXINE SODIUM 75 MCG PO TABS
75.0000 ug | ORAL_TABLET | Freq: Every day | ORAL | 0 refills | Status: DC
Start: 2023-10-21 — End: 2024-01-25
  Filled 2023-10-21: qty 90, 90d supply, fill #0

## 2023-10-21 NOTE — Progress Notes (Signed)
 Assessment/Plan:    Assessment and Plan    Migraine Headaches Chronic migraines occurring once a month, lasting approximately four days. Uses Excedrin Migraine and sumatriptan 100 mg as needed. Sumatriptan effective but causes residual malaise. Discussed potential alternatives, but patient prefers current regimen. Informed consent obtained for sumatriptan use. - Refill sumatriptan 100 mg as needed - Continue Excedrin Migraine as needed  Hypothyroidism Well-managed on levothyroxine 75 mcg daily. No symptoms of thyroid dysfunction. Last TSH check in July was stable. Informed consent obtained for continued management. - Continue levothyroxine 75 mcg daily  Osteoporosis On alendronate 70 mg weekly. Last DEXA scan two years ago showed stable bone density. Takes vitamin D and multivitamin with calcium. Discussed potential switch to Prolia or other injectable treatments. Informed consent obtained for current and potential future treatments. - Continue alendronate 70 mg weekly - Order DEXA scan - Discuss potential switch to Prolia or other injectable treatments at next visit  Actinic Keratosis Previously treated with medication and cryotherapy. No new lesions or issues reported. Informed consent obtained for current management. - Continue current management  Medication Refill and Chronic Management Reports stable mood on sertraline 100 mg daily. No new symptoms reported. Informed consent obtained for medication refills. - Refill sertraline 100 mg for 90 days  General Health Maintenance Agreed to receive the flu shot. No history of adverse reactions to the flu vaccine. Informed consent obtained for flu vaccination. - Administer flu shot  Follow-up - Follow-up in one year - Review DEXA scan results when available.       Medications Discontinued During This Encounter  Medication Reason   SUMAtriptan (IMITREX) 100 MG tablet Reorder   alendronate (FOSAMAX) 70 MG tablet Reorder    sertraline (ZOLOFT) 100 MG tablet Reorder   levothyroxine (SYNTHROID) 75 MCG tablet Reorder    Return in about 1 year (around 10/20/2024) for physical (fasting labs).    Subjective:   Encounter date: 10/21/2023  Elizabeth Obrien is a 63 y.o. female who has Migraine; GERD (gastroesophageal reflux disease); Hand eczema; Anxiety and depression; Postsurgical hypothyroidism; Atrophic vaginitis; Age-related osteoporosis without current pathological fracture; Skin lesion; Moderate mixed hyperlipidemia not requiring statin therapy; Right buttock pain; Prediabetes; Asymptomatic varicose veins of left lower extremity; Atypical squamous cells of undetermined significance on cytologic smear of cervix (ASC-US); Encounter for consultation; Impaired fasting glucose; Major depressive disorder, single episode, unspecified; Rhytides; Thyroid dysfunction; and Osteoporosis on their problem list..   She  has a past medical history of Anxiety and depression, Elevated blood sugar, GERD (gastroesophageal reflux disease), Hand eczema, Hypothyroidism, iatrogenic, and Migraine..   She presents with chief complaint of Medical Management of Chronic Issues (Medication refills. Agreed to influenza vaccine, but drug allergy warning popped up. ) .   Discussed the use of AI scribe software for clinical note transcription with the patient, who gave verbal consent to proceed.  History of Present Illness   Elizabeth Obrien is a 63 year old female who presents for medication refills and chronic management.  She is currently on sertraline 100 mg for depression, which she feels is effective. Her mood is generally stable but worsens with headaches. No new symptoms related to depression are noted.  She has experienced chronic headaches since age 72, currently occurring once a month and lasting about four days. She uses Excedrin Migraine and sumatriptan 100 mg for management. Excedrin provides relief, but she resorts to  sumatriptan monthly, which alleviates the headache but causes post-treatment malaise. She tries to avoid sumatriptan due  to these side effects.  She has hypothyroidism and takes levothyroxine 75 mcg daily. A partial thyroidectomy was performed approximately seven years ago. She reports no symptoms such as palpitations, constipation, diarrhea, or changes in temperature tolerance. Her thyroid levels have been stable since the last check in July.  She manages osteoporosis with alendronate 70 mg, vitamin D, and a multivitamin with calcium. Two DEXA scans since starting alendronate have shown stable results. She occasionally uses Tums for heartburn but not as a calcium supplement.  She has a history of actinic keratosis treated with medication and cryotherapy on her nose. No further issues with this condition are reported.         10/21/2023   10:08 AM 02/15/2023    9:28 AM 12/17/2021    7:02 AM 11/15/2020    9:26 AM 05/31/2018   11:41 AM  Depression screen PHQ 2/9  Decreased Interest 1 0 0 0 1  Down, Depressed, Hopeless 1 0 0 0 2  PHQ - 2 Score 2 0 0 0 3  Altered sleeping 1 0   1  Tired, decreased energy 1 0   1  Change in appetite 1 0   2  Feeling bad or failure about yourself  1 0   2  Trouble concentrating 1 0   1  Moving slowly or fidgety/restless 0 0   2  Suicidal thoughts 0 0   0  PHQ-9 Score 7 0   12  Difficult doing work/chores Not difficult at all Not difficult at all         02/15/2023    9:28 AM  GAD 7 : Generalized Anxiety Score  Nervous, Anxious, on Edge 0  Control/stop worrying 0  Worry too much - different things 0  Trouble relaxing 0  Restless 0  Easily annoyed or irritable 0  Afraid - awful might happen 0  Total GAD 7 Score 0  Anxiety Difficulty Not difficult at all       Review of Systems  All other systems reviewed and are negative.   Past Surgical History:  Procedure Laterality Date   BREAST SURGERY  09/07/1997   augmentation - saline   COLONOSCOPY   03/31/2016   Dr.Nandigam   COLONOSCOPY  01/23/2022   OPEN REDUCTION INTERNAL FIXATION (ORIF) DISTAL RADIAL FRACTURE Right 11/27/2014   Procedure: OPEN REDUCTION INTERNAL FIXATION (ORIF) RIGHT  DISTAL RADIAL FRACTURE WITH ALLOGRAFT BONE GRAFTING AND REPAIR;  Surgeon: Dominica Severin, MD;  Location: [facility] OR;  Service: Orthopedics;  Laterality: Right;   POLYPECTOMY     right knee  09/07/2002   arthroscopic   THYROID SURGERY  09/07/2009    Outpatient Medications Prior to Visit  Medication Sig Dispense Refill   Ascorbic Acid (VITAMIN C) 1000 MG tablet Take 1,000 mg by mouth daily. Reported on 03/17/2016     aspirin-acetaminophen-caffeine (EXCEDRIN MIGRAINE) 250-250-65 MG per tablet Take by mouth every 6 (six) hours as needed for headache.     conjugated estrogens (PREMARIN) vaginal cream Insert 1/2 gram vaginally twice a week 30 g 3   conjugated estrogens (PREMARIN) vaginal cream Place 0.5 grams vaginally 2 (two) times a week. Need appointment for refills 30 g 0   Multiple Minerals-Vitamins (CALCIUM 600+D3 PLUS MINERALS) TABS Take 1 tablet by mouth daily at 12 noon.     nystatin-triamcinolone ointment (MYCOLOG) Apply 1 Application topically 2 (two) times daily. Apply for 10 days then Stop 60 g 0   VITAMIN D PO Take by mouth.  alendronate (FOSAMAX) 70 MG tablet Take 1 tablet (70 mg total) by mouth once a week. 12 tablet 3   levothyroxine (SYNTHROID) 75 MCG tablet Take 1 tablet (75 mcg total) by mouth daily before breakfast. Please call [phone] to schedule an appointment for further refills. 90 tablet 0   sertraline (ZOLOFT) 100 MG tablet Take 1 tablet (100 mg total) by mouth daily. Need appointment for refills 90 tablet 0   SUMAtriptan (IMITREX) 100 MG tablet TAKE 1 TABLET BY MOUTH AT ONSET OF MIGRAINE MAY REPEAT ONCE IN 2 HOURS 9 tablet 3   No facility-administered medications prior to visit.    Family History  Problem Relation Age of Onset   Cancer Mother 27       breast, stomach ca    Colon polyps Father    Colon cancer Father 78   Colon polyps Paternal Aunt    Colon cancer Paternal Aunt    Colon polyps Paternal Grandmother    Colon cancer Paternal Grandmother    Cancer Other        thyroid   Esophageal cancer Neg Hx    Stomach cancer Neg Hx    Rectal cancer Neg Hx     Social History   Socioeconomic History   Marital status: Married    Spouse name: Not on file   Number of children: 2   Years of education: Not on file   Highest education level: Not on file  Occupational History   Occupation: PACE nurse    Employer: [employer]  Tobacco Use   Smoking status: Former    Current packs/day: 0.00    Types: Cigarettes    Quit date: 10/27/2000    Years since quitting: 22.9    Passive exposure: Never   Smokeless tobacco: Never   Tobacco comments:    smoked for 10 years, quit in 2002  Vaping Use   Vaping status: Never Used  Substance and Sexual Activity   Alcohol use: Never   Drug use: Never   Sexual activity: Yes    Birth control/protection: Post-menopausal  Other Topics Concern   Not on file  Social History Narrative   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 [employer] Situation: lives with husband      Spiritual Beliefs: jewish      Lifestyle: no regular cv exercise; diet is healthy      Social Drivers of Corporate investment banker Strain: Not on file  Food Insecurity: Not on file  Transportation Needs: Not on file  Physical Activity: Not on file  Stress: Not on file  Social Connections: Not on file  Intimate Partner Violence: Not on file                                                                                                  Objective:  Physical Exam: BP 112/76   Pulse 80   Temp (!) 97.5 F (36.4 C) (Temporal)   Wt 167 lb 6.4 oz (75.9 kg)   LMP  (LMP Unknown)   SpO2 98%   BMI 24.02  kg/m     Physical Exam  No results found.  No results found for this or any previous visit (from the past 2160 hours).      Garner Nash, MD, MS

## 2023-11-02 ENCOUNTER — Ambulatory Visit: Payer: Medicaid Other | Admitting: Family Medicine

## 2024-01-25 ENCOUNTER — Other Ambulatory Visit: Payer: Self-pay | Admitting: Family Medicine

## 2024-01-25 ENCOUNTER — Other Ambulatory Visit: Payer: Self-pay

## 2024-01-25 ENCOUNTER — Other Ambulatory Visit (HOSPITAL_COMMUNITY): Payer: Self-pay

## 2024-01-25 DIAGNOSIS — E890 Postprocedural hypothyroidism: Secondary | ICD-10-CM

## 2024-01-25 MED ORDER — LEVOTHYROXINE SODIUM 75 MCG PO TABS
75.0000 ug | ORAL_TABLET | Freq: Every day | ORAL | 0 refills | Status: DC
Start: 2024-01-25 — End: 2024-04-18
  Filled 2024-01-25: qty 90, 90d supply, fill #0

## 2024-04-18 ENCOUNTER — Other Ambulatory Visit: Payer: Self-pay | Admitting: Family Medicine

## 2024-04-18 ENCOUNTER — Other Ambulatory Visit (HOSPITAL_COMMUNITY): Payer: Self-pay

## 2024-04-18 ENCOUNTER — Other Ambulatory Visit: Payer: Self-pay

## 2024-04-18 DIAGNOSIS — E890 Postprocedural hypothyroidism: Secondary | ICD-10-CM

## 2024-04-18 MED ORDER — LEVOTHYROXINE SODIUM 75 MCG PO TABS
75.0000 ug | ORAL_TABLET | Freq: Every day | ORAL | 0 refills | Status: DC
Start: 2024-04-18 — End: 2024-07-25
  Filled 2024-04-18: qty 90, 90d supply, fill #0

## 2024-06-15 ENCOUNTER — Ambulatory Visit (HOSPITAL_BASED_OUTPATIENT_CLINIC_OR_DEPARTMENT_OTHER)
Admission: RE | Admit: 2024-06-15 | Discharge: 2024-06-15 | Disposition: A | Discharge status: Home / Self Care | Source: Ambulatory Visit | Attending: Family Medicine | Admitting: Family Medicine

## 2024-06-15 ENCOUNTER — Other Ambulatory Visit: Payer: Medicaid Other

## 2024-06-15 ENCOUNTER — Ambulatory Visit: Payer: Self-pay | Admitting: Family Medicine

## 2024-06-15 DIAGNOSIS — M810 Age-related osteoporosis without current pathological fracture: Secondary | ICD-10-CM | POA: Diagnosis present

## 2024-06-27 ENCOUNTER — Other Ambulatory Visit (HOSPITAL_BASED_OUTPATIENT_CLINIC_OR_DEPARTMENT_OTHER)

## 2024-07-11 ENCOUNTER — Telehealth: Payer: Self-pay

## 2024-07-11 DIAGNOSIS — R7309 Other abnormal glucose: Secondary | ICD-10-CM

## 2024-07-11 DIAGNOSIS — G43009 Migraine without aura, not intractable, without status migrainosus: Secondary | ICD-10-CM

## 2024-07-11 DIAGNOSIS — Z79899 Other long term (current) drug therapy: Secondary | ICD-10-CM

## 2024-07-11 DIAGNOSIS — R7303 Prediabetes: Secondary | ICD-10-CM

## 2024-07-11 DIAGNOSIS — E782 Mixed hyperlipidemia: Secondary | ICD-10-CM

## 2024-07-11 DIAGNOSIS — E890 Postprocedural hypothyroidism: Secondary | ICD-10-CM

## 2024-07-11 DIAGNOSIS — M810 Age-related osteoporosis without current pathological fracture: Secondary | ICD-10-CM

## 2024-07-11 NOTE — Telephone Encounter (Signed)
 LOV 10/21/2023 FOV NONE SCHEDULED

## 2024-07-11 NOTE — Telephone Encounter (Signed)
 Copied from CRM [id number]. Topic: Clinical - Request for Lab/Test Order >> Jul 11, 2024  8:48 AM Revonda D wrote: Reason for CRM: Pt is requesting a lab appt to come in for blood work so she can get her medications refilled. Pt needs to have the lab order submitted and would like a callback to get the appt scheduled.

## 2024-07-16 DIAGNOSIS — Z79899 Other long term (current) drug therapy: Secondary | ICD-10-CM | POA: Insufficient documentation

## 2024-07-16 DIAGNOSIS — R7309 Other abnormal glucose: Secondary | ICD-10-CM | POA: Insufficient documentation

## 2024-07-16 NOTE — Addendum Note (Signed)
 Addended by: SEBASTIAN RIGHTER B on: 07/16/2024 03:09 PM   Modules accepted: Orders

## 2024-07-16 NOTE — Telephone Encounter (Signed)
 Labs are ordered.  Please have patient schedule lab visit fasting.  Then follow-up with an office visit to discuss further.

## 2024-07-24 ENCOUNTER — Other Ambulatory Visit (INDEPENDENT_AMBULATORY_CARE_PROVIDER_SITE_OTHER)

## 2024-07-24 DIAGNOSIS — M810 Age-related osteoporosis without current pathological fracture: Secondary | ICD-10-CM

## 2024-07-24 DIAGNOSIS — Z79899 Other long term (current) drug therapy: Secondary | ICD-10-CM

## 2024-07-24 DIAGNOSIS — E890 Postprocedural hypothyroidism: Secondary | ICD-10-CM | POA: Diagnosis not present

## 2024-07-24 DIAGNOSIS — R7303 Prediabetes: Secondary | ICD-10-CM | POA: Diagnosis not present

## 2024-07-24 DIAGNOSIS — R7309 Other abnormal glucose: Secondary | ICD-10-CM

## 2024-07-24 DIAGNOSIS — E782 Mixed hyperlipidemia: Secondary | ICD-10-CM | POA: Diagnosis not present

## 2024-07-24 DIAGNOSIS — G43009 Migraine without aura, not intractable, without status migrainosus: Secondary | ICD-10-CM | POA: Diagnosis not present

## 2024-07-24 LAB — LIPID PANEL
Cholesterol: 204 mg/dL — ABNORMAL HIGH (ref 0–200)
HDL: 60.8 mg/dL (ref >39.00)
LDL Cholesterol: 129 mg/dL — ABNORMAL HIGH (ref 0–99)
NonHDL: 142.86
Total CHOL/HDL Ratio: 3
Triglycerides: 71 mg/dL (ref 0.0–149.0)
VLDL: 14.2 mg/dL (ref 0.0–40.0)

## 2024-07-24 LAB — CBC WITH DIFFERENTIAL/PLATELET
Basophils Absolute: 0 K/uL (ref 0.0–0.1)
Basophils Relative: 0.6 % (ref 0.0–3.0)
Eosinophils Absolute: 0.3 K/uL (ref 0.0–0.7)
Eosinophils Relative: 4.9 % (ref 0.0–5.0)
HCT: 40.8 % (ref 36.0–46.0)
Hemoglobin: 13.6 g/dL (ref 12.0–15.0)
Lymphocytes Relative: 37.6 % (ref 12.0–46.0)
Lymphs Abs: 2.2 K/uL (ref 0.7–4.0)
MCHC: 33.2 g/dL (ref 30.0–36.0)
MCV: 90.9 fl (ref 78.0–100.0)
Monocytes Absolute: 0.5 K/uL (ref 0.1–1.0)
Monocytes Relative: 8.8 % (ref 3.0–12.0)
Neutro Abs: 2.8 K/uL (ref 1.4–7.7)
Neutrophils Relative %: 48.1 % (ref 43.0–77.0)
Platelets: 225 K/uL (ref 150.0–400.0)
RBC: 4.49 Mil/uL (ref 3.87–5.11)
RDW: 13.1 % (ref 11.5–15.5)
WBC: 5.8 K/uL (ref 4.0–10.5)

## 2024-07-24 LAB — COMPREHENSIVE METABOLIC PANEL WITH GFR
ALT: 15 U/L (ref 0–35)
AST: 21 U/L (ref 0–37)
Albumin: 4 g/dL (ref 3.5–5.2)
Alkaline Phosphatase: 51 U/L (ref 39–117)
BUN: 10 mg/dL (ref 6–23)
CO2: 28 meq/L (ref 19–32)
Calcium: 9.2 mg/dL (ref 8.4–10.5)
Chloride: 105 meq/L (ref 96–112)
Creatinine, Ser: 0.75 mg/dL (ref 0.40–1.20)
GFR: 84.01 mL/min (ref >60.00)
Glucose, Bld: 95 mg/dL (ref 70–99)
Potassium: 4 meq/L (ref 3.5–5.1)
Sodium: 140 meq/L (ref 135–145)
Total Bilirubin: 0.7 mg/dL (ref 0.2–1.2)
Total Protein: 7.2 g/dL (ref 6.0–8.3)

## 2024-07-24 LAB — MICROALBUMIN / CREATININE URINE RATIO
Creatinine,U: 109.4 mg/dL
Microalb Creat Ratio: UNDETERMINED mg/g (ref 0.0–30.0)
Microalb, Ur: <0.7 mg/dL

## 2024-07-24 LAB — HIGH SENSITIVITY CRP: CRP, High Sensitivity: 1.86 mg/L (ref 0.000–5.000)

## 2024-07-24 LAB — B12 AND FOLATE PANEL
Folate: 23.2 ng/mL (ref >5.9)
Vitamin B-12: 348 pg/mL (ref 211–911)

## 2024-07-24 LAB — HEMOGLOBIN A1C: Hgb A1c MFr Bld: 5.8 % (ref 4.6–6.5)

## 2024-07-25 ENCOUNTER — Other Ambulatory Visit: Payer: Self-pay | Admitting: Family Medicine

## 2024-07-25 ENCOUNTER — Other Ambulatory Visit: Payer: Self-pay

## 2024-07-25 ENCOUNTER — Other Ambulatory Visit (HOSPITAL_COMMUNITY): Payer: Self-pay

## 2024-07-25 DIAGNOSIS — E890 Postprocedural hypothyroidism: Secondary | ICD-10-CM

## 2024-07-25 MED ORDER — LEVOTHYROXINE SODIUM 75 MCG PO TABS
75.0000 ug | ORAL_TABLET | Freq: Every day | ORAL | 0 refills | Status: AC
Start: 2024-07-25 — End: ?
  Filled 2024-07-25: qty 90, 90d supply, fill #0

## 2024-07-27 LAB — VITAMIN D 1,25 DIHYDROXY
Vitamin D 1, 25 (OH)2 Total: 31 pg/mL (ref 18–72)
Vitamin D2 1, 25 (OH)2: <8 pg/mL
Vitamin D3 1, 25 (OH)2: 31 pg/mL

## 2024-07-27 LAB — IRON,TIBC AND FERRITIN PANEL
%SAT: 22 % (ref 16–45)
Ferritin: 37 ng/mL (ref 16–288)
Iron: 72 ug/dL (ref 45–288)
TIBC: 324 ug/dL (ref 250–450)

## 2024-07-27 LAB — INSULIN, RANDOM: Insulin: 6.4 u[IU]/mL

## 2024-07-27 LAB — URINALYSIS W MICROSCOPIC + REFLEX CULTURE

## 2024-08-15 ENCOUNTER — Other Ambulatory Visit (HOSPITAL_COMMUNITY): Payer: Self-pay

## 2024-08-15 MED ORDER — PREMARIN 0.625 MG/GM VA CREA
0.5000 g | TOPICAL_CREAM | VAGINAL | 3 refills | Status: AC
  Filled 2024-08-15: qty 30, 90d supply, fill #0

## 2024-08-30 ENCOUNTER — Ambulatory Visit: Admitting: Family Medicine

## 2024-10-09 ENCOUNTER — Encounter: Admitting: Family Medicine

## 2024-11-09 ENCOUNTER — Encounter: Admitting: Family Medicine
